# Patient Record
Sex: Female | Born: 1935 | Race: White | Hispanic: No | Smoking: Never smoker
Health system: Southern US, Community
[De-identification: ages and names within clinical notes are randomized; demographics above are authoritative.]

## PROBLEM LIST (undated history)

## (undated) DIAGNOSIS — K648 Other hemorrhoids: Secondary | ICD-10-CM

## (undated) DIAGNOSIS — K5790 Diverticulosis of intestine, part unspecified, without perforation or abscess without bleeding: Secondary | ICD-10-CM

## (undated) DIAGNOSIS — K219 Gastro-esophageal reflux disease without esophagitis: Secondary | ICD-10-CM

## (undated) DIAGNOSIS — I10 Essential (primary) hypertension: Secondary | ICD-10-CM

## (undated) DIAGNOSIS — Q613 Polycystic kidney, unspecified: Secondary | ICD-10-CM

## (undated) DIAGNOSIS — L439 Lichen planus, unspecified: Secondary | ICD-10-CM

## (undated) DIAGNOSIS — D126 Benign neoplasm of colon, unspecified: Secondary | ICD-10-CM

## (undated) HISTORY — DX: Benign neoplasm of colon, unspecified: D12.6

## (undated) HISTORY — PX: OOPHORECTOMY: SHX86

## (undated) HISTORY — DX: Polycystic kidney, unspecified: Q61.3

## (undated) HISTORY — PX: CHOLECYSTECTOMY: SHX55

## (undated) HISTORY — DX: Essential (primary) hypertension: I10

## (undated) HISTORY — PX: PARTIAL HYSTERECTOMY: SHX80

## (undated) HISTORY — PX: APPENDECTOMY: SHX54

## (undated) HISTORY — DX: Lichen planus, unspecified: L43.9

## (undated) HISTORY — DX: Other hemorrhoids: K64.8

## (undated) HISTORY — DX: Diverticulosis of intestine, part unspecified, without perforation or abscess without bleeding: K57.90

## (undated) HISTORY — DX: Gastro-esophageal reflux disease without esophagitis: K21.9

---

## 1994-05-29 ENCOUNTER — Encounter: Payer: Self-pay | Admitting: Gastroenterology

## 1997-12-03 ENCOUNTER — Ambulatory Visit (HOSPITAL_COMMUNITY): Admission: RE | Admit: 1997-12-03 | Discharge: 1997-12-03 | Payer: Self-pay | Admitting: Obstetrics & Gynecology

## 1998-04-12 ENCOUNTER — Ambulatory Visit (HOSPITAL_COMMUNITY): Admission: RE | Admit: 1998-04-12 | Discharge: 1998-04-12 | Payer: Self-pay | Admitting: Gynecology

## 1998-10-28 ENCOUNTER — Other Ambulatory Visit: Admission: RE | Admit: 1998-10-28 | Discharge: 1998-10-28 | Payer: Self-pay | Admitting: *Deleted

## 1998-10-28 ENCOUNTER — Ambulatory Visit (HOSPITAL_COMMUNITY): Admission: RE | Admit: 1998-10-28 | Discharge: 1998-10-28 | Payer: Self-pay | Admitting: Obstetrics & Gynecology

## 1999-04-06 ENCOUNTER — Ambulatory Visit (HOSPITAL_COMMUNITY): Admission: RE | Admit: 1999-04-06 | Discharge: 1999-04-06 | Payer: Self-pay | Admitting: Gynecology

## 1999-04-06 ENCOUNTER — Encounter: Payer: Self-pay | Admitting: Gynecology

## 1999-10-13 ENCOUNTER — Ambulatory Visit (HOSPITAL_COMMUNITY): Admission: RE | Admit: 1999-10-13 | Discharge: 1999-10-13 | Payer: Self-pay | Admitting: Obstetrics & Gynecology

## 2000-04-08 ENCOUNTER — Ambulatory Visit (HOSPITAL_COMMUNITY): Admission: RE | Admit: 2000-04-08 | Discharge: 2000-04-08 | Payer: Self-pay | Admitting: Gynecology

## 2000-04-08 ENCOUNTER — Encounter: Payer: Self-pay | Admitting: Gynecology

## 2000-09-27 ENCOUNTER — Ambulatory Visit (HOSPITAL_COMMUNITY): Admission: RE | Admit: 2000-09-27 | Discharge: 2000-09-27 | Payer: Self-pay | Admitting: Obstetrics & Gynecology

## 2001-02-20 DIAGNOSIS — D126 Benign neoplasm of colon, unspecified: Secondary | ICD-10-CM

## 2001-02-20 HISTORY — DX: Benign neoplasm of colon, unspecified: D12.6

## 2001-03-05 ENCOUNTER — Encounter (INDEPENDENT_AMBULATORY_CARE_PROVIDER_SITE_OTHER): Payer: Self-pay | Admitting: Specialist

## 2001-03-05 ENCOUNTER — Encounter: Payer: Self-pay | Admitting: Gastroenterology

## 2001-03-05 ENCOUNTER — Other Ambulatory Visit: Admission: RE | Admit: 2001-03-05 | Discharge: 2001-03-05 | Payer: Self-pay | Admitting: Gastroenterology

## 2001-04-21 ENCOUNTER — Ambulatory Visit (HOSPITAL_COMMUNITY): Admission: RE | Admit: 2001-04-21 | Discharge: 2001-04-21 | Payer: Self-pay

## 2001-07-09 ENCOUNTER — Encounter: Payer: Self-pay | Admitting: Gastroenterology

## 2001-10-10 ENCOUNTER — Ambulatory Visit (HOSPITAL_COMMUNITY): Admission: RE | Admit: 2001-10-10 | Discharge: 2001-10-10 | Payer: Self-pay | Admitting: Obstetrics & Gynecology

## 2002-04-22 ENCOUNTER — Ambulatory Visit (HOSPITAL_COMMUNITY): Admission: RE | Admit: 2002-04-22 | Discharge: 2002-04-22 | Payer: Self-pay | Admitting: *Deleted

## 2002-07-29 ENCOUNTER — Encounter: Payer: Self-pay | Admitting: Gastroenterology

## 2002-10-16 ENCOUNTER — Ambulatory Visit (HOSPITAL_COMMUNITY): Admission: RE | Admit: 2002-10-16 | Discharge: 2002-10-16 | Payer: Self-pay | Admitting: *Deleted

## 2003-05-12 ENCOUNTER — Ambulatory Visit (HOSPITAL_COMMUNITY): Admission: RE | Admit: 2003-05-12 | Discharge: 2003-05-12 | Payer: Self-pay | Admitting: Gynecology

## 2003-07-07 ENCOUNTER — Ambulatory Visit (HOSPITAL_COMMUNITY): Admission: RE | Admit: 2003-07-07 | Discharge: 2003-07-07 | Payer: Self-pay | Admitting: Gynecology

## 2003-07-07 ENCOUNTER — Encounter (INDEPENDENT_AMBULATORY_CARE_PROVIDER_SITE_OTHER): Payer: Self-pay | Admitting: Specialist

## 2004-05-16 ENCOUNTER — Ambulatory Visit (HOSPITAL_COMMUNITY): Admission: RE | Admit: 2004-05-16 | Discharge: 2004-05-16 | Payer: Self-pay | Admitting: Gynecology

## 2004-08-08 ENCOUNTER — Ambulatory Visit: Payer: Self-pay | Admitting: Gastroenterology

## 2004-08-28 ENCOUNTER — Ambulatory Visit: Payer: Self-pay | Admitting: Gastroenterology

## 2005-01-04 ENCOUNTER — Emergency Department (HOSPITAL_COMMUNITY): Admission: EM | Admit: 2005-01-04 | Discharge: 2005-01-04 | Payer: Self-pay | Admitting: Emergency Medicine

## 2005-05-17 ENCOUNTER — Ambulatory Visit (HOSPITAL_COMMUNITY): Admission: RE | Admit: 2005-05-17 | Discharge: 2005-05-17 | Payer: Self-pay | Admitting: Gynecology

## 2005-05-23 ENCOUNTER — Other Ambulatory Visit: Admission: RE | Admit: 2005-05-23 | Discharge: 2005-05-23 | Payer: Self-pay | Admitting: Gynecology

## 2005-11-14 ENCOUNTER — Ambulatory Visit: Payer: Self-pay | Admitting: Gastroenterology

## 2006-05-20 ENCOUNTER — Ambulatory Visit (HOSPITAL_COMMUNITY): Admission: RE | Admit: 2006-05-20 | Discharge: 2006-05-20 | Payer: Self-pay | Admitting: Family Medicine

## 2006-08-21 ENCOUNTER — Ambulatory Visit: Payer: Self-pay | Admitting: Gastroenterology

## 2006-09-04 ENCOUNTER — Encounter (INDEPENDENT_AMBULATORY_CARE_PROVIDER_SITE_OTHER): Payer: Self-pay | Admitting: *Deleted

## 2006-09-04 ENCOUNTER — Ambulatory Visit: Payer: Self-pay | Admitting: Gastroenterology

## 2006-11-13 ENCOUNTER — Other Ambulatory Visit: Admission: RE | Admit: 2006-11-13 | Discharge: 2006-11-13 | Payer: Self-pay | Admitting: Gynecology

## 2006-11-14 ENCOUNTER — Encounter: Admission: RE | Admit: 2006-11-14 | Discharge: 2006-11-14 | Payer: Self-pay | Admitting: Gynecology

## 2006-12-25 ENCOUNTER — Ambulatory Visit: Payer: Self-pay | Admitting: Internal Medicine

## 2007-05-22 ENCOUNTER — Ambulatory Visit (HOSPITAL_COMMUNITY): Admission: RE | Admit: 2007-05-22 | Discharge: 2007-05-22 | Payer: Self-pay | Admitting: Gynecology

## 2007-08-15 ENCOUNTER — Encounter: Admission: RE | Admit: 2007-08-15 | Discharge: 2007-08-15 | Payer: Self-pay | Admitting: Gynecology

## 2007-10-08 ENCOUNTER — Ambulatory Visit: Payer: Self-pay | Admitting: Gastroenterology

## 2008-05-24 ENCOUNTER — Ambulatory Visit (HOSPITAL_COMMUNITY): Admission: RE | Admit: 2008-05-24 | Discharge: 2008-05-24 | Payer: Self-pay | Admitting: Gynecology

## 2008-08-30 ENCOUNTER — Ambulatory Visit: Payer: Self-pay | Admitting: Gastroenterology

## 2008-09-14 ENCOUNTER — Encounter: Payer: Self-pay | Admitting: Gastroenterology

## 2008-09-14 ENCOUNTER — Ambulatory Visit: Payer: Self-pay | Admitting: Gastroenterology

## 2008-09-15 ENCOUNTER — Encounter: Payer: Self-pay | Admitting: Gastroenterology

## 2009-05-25 ENCOUNTER — Ambulatory Visit (HOSPITAL_COMMUNITY): Admission: RE | Admit: 2009-05-25 | Discharge: 2009-05-25 | Payer: Self-pay | Admitting: Family Medicine

## 2009-09-23 ENCOUNTER — Encounter: Admission: RE | Admit: 2009-09-23 | Discharge: 2009-09-23 | Payer: Self-pay | Admitting: Family Medicine

## 2009-09-29 ENCOUNTER — Telehealth: Payer: Self-pay | Admitting: Gastroenterology

## 2010-05-29 ENCOUNTER — Ambulatory Visit (HOSPITAL_COMMUNITY): Admission: RE | Admit: 2010-05-29 | Discharge: 2010-05-29 | Payer: Self-pay | Admitting: Gynecology

## 2010-06-06 ENCOUNTER — Telehealth: Payer: Self-pay | Admitting: Gastroenterology

## 2010-06-07 ENCOUNTER — Encounter (INDEPENDENT_AMBULATORY_CARE_PROVIDER_SITE_OTHER): Payer: Self-pay

## 2010-08-04 ENCOUNTER — Encounter: Payer: Self-pay | Admitting: Gastroenterology

## 2010-08-11 ENCOUNTER — Encounter: Payer: Self-pay | Admitting: Gastroenterology

## 2010-08-13 ENCOUNTER — Encounter: Payer: Self-pay | Admitting: Gynecology

## 2010-08-21 ENCOUNTER — Encounter: Payer: Self-pay | Admitting: Gastroenterology

## 2010-08-22 NOTE — Progress Notes (Signed)
Summary: Painful irritable rectal area  Phone Note Call from Patient Call back at Home Phone [phone]   Call For: Dr Russella Dar Reason for Call: Talk to Nurse Summary of Call: Very irritable rectal area and its very painful has never had this before and would like to get an appoinment to be seen.  Told office is already closed and nurse would call back in the morning said that was ok and if it got unbereable she would go to ed or urgent care. Initial call taken by: Leanor Kail [facility],  June 06, 2010 5:02 PM  Follow-up for Phone Call        phone is busy.  I will continue to try and reach the patient  Darcey Nora RN, [facility]  June 07, 2010 9:39 AM  Patient c/o reddened painful rash around rectum and lower buttocks.  No other complaints.  Patient  is advised to start Monistat cream to the rectal area she is asked to call back if she has any other complaints or no improvement in her symptoms. Follow-up by: Darcey Nora RN, CGRN,  June 07, 2010 10:00 AM  Additional Follow-up for Phone Call Additional follow up Details #1::        Agree with above and if no better will need to be seen. Please obtain a copy of the UGI series referred to in the 09/2009 phone note.  Additional Follow-up by: Meryl Dare MD Clementeen Graham,  June 07, 2010 10:07 AM

## 2010-08-22 NOTE — Progress Notes (Signed)
Summary: had ugi at [facility] Note Call from Patient Call back at Home Phone [phone]   Caller: Patient Call For: Russella Dar Reason for Call: Talk to Nurse Summary of Call: Patient states that she had a procedure (ugi) and was having them fax it over to Dr Russella Dar. Please call her when you receive those results Initial call taken by: Tawni Levy,  September 29, 2009 11:45 AM  Follow-up for Phone Call        Patient  had UGI series after seeing her primary care MD, they are send ing [place] a copy for her file.  I advied patient I will call her back if we don't recieve it. Follow-up by: Darcey Nora RN, CGRN,  September 29, 2009 11:51 AM

## 2010-08-24 NOTE — Letter (Signed)
Summary: Pre Visit Letter Revised  [facility]  [address] [place], [place] [address]   Phone: [phone]  Fax: [phone]        08/04/2010 MRN: [id number]  [facility] [address] [place], [place]  [address]             Procedure Date:  09-11-10 9:30am            Dr Russella Dar - Recall   Colon  Welcome to the Gastroenterology Division at [facility].    You are scheduled to see a nurse for your pre-procedure visit on 08-28-10 at 9am on the 3rd floor at [facility], [address]  We ask that you try to arrive at our office 15 minutes prior to your appointment time to allow for check-in.  Please take a minute to review the attached form.  If you answer "Yes" to one or more of the questions on the first page, we ask that you call the person listed at your earliest opportunity.  If you answer "No" to all of the questions, please complete the rest of the form and bring it to your appointment.    Your nurse visit will consist of discussing your medical and surgical history, your immediate family medical history, and your medications.   If you are unable to list all of your medications on the form, please bring the medication bottles to your appointment and we will list them.  We will need to be aware of both prescribed and over the counter drugs.  We will need to know exact dosage information as well.    Please be prepared to read and sign documents such as consent forms, a financial agreement, and acknowledgement forms.  If necessary, and with your consent, a friend or relative is welcome to sit-in on the nurse visit with you.  Please bring your insurance card so that we may make a copy of it.  If your insurance requires a referral to see a specialist, please bring your referral form from your primary care physician.  No co-pay is required for this nurse visit.     If you cannot keep your appointment, please call [phone] to cancel or reschedule  prior to your appointment date.  This allows [place] the opportunity to schedule an appointment for another patient in need of care.    Thank you for choosing [facility] for your medical needs.  We appreciate the opportunity to care for you.  Please visit [place] at our website  to learn more about our practice.  Sincerely, The Gastroenterology Division

## 2010-08-25 ENCOUNTER — Encounter: Payer: Self-pay | Admitting: Gastroenterology

## 2010-08-25 ENCOUNTER — Ambulatory Visit (INDEPENDENT_AMBULATORY_CARE_PROVIDER_SITE_OTHER): Payer: Medicare Other | Admitting: Gastroenterology

## 2010-08-25 DIAGNOSIS — Z860100 Personal history of colon polyps, unspecified: Secondary | ICD-10-CM

## 2010-08-25 DIAGNOSIS — K219 Gastro-esophageal reflux disease without esophagitis: Secondary | ICD-10-CM | POA: Insufficient documentation

## 2010-08-25 DIAGNOSIS — R1319 Other dysphagia: Secondary | ICD-10-CM

## 2010-08-28 ENCOUNTER — Telehealth: Payer: Self-pay | Admitting: Gastroenterology

## 2010-08-30 NOTE — Procedures (Signed)
Summary: Colonoscopy   Colonoscopy  Procedure date:  08/28/2004  Findings:      Results: Polyp.  Results: Diverticulosis.       Location:  [facility].    Procedures Next Due Date:    Colonoscopy: 08/2006  Colonoscopy  Procedure date:  08/28/2004  Findings:      Results: Polyp.  Results: Diverticulosis.       Location:  [facility].    Procedures Next Due Date:    Colonoscopy: 08/2006 Patient Name: Natayla, Cadenhead MRN:  Procedure Procedures: Colonoscopy CPT: 16109.    with polypectomy. CPT: A3573898.  Personnel: Endoscopist: Venita Lick. Russella Dar, MD, Clementeen Graham.  Exam Location: Exam performed in Outpatient Clinic. Outpatient  Patient Consent: Procedure, Alternatives, Risks and Benefits discussed, consent obtained, from patient. Consent was obtained by the RN.  Indications  Surveillance of: Adenomatous Polyp(s). Initial polypectomy was performed in 2002. in Aug. Largest polyp removed was > 19 mm. Pathology of worst  polyp: tubulovillous adenoma.  History  Current Medications: Patient is not currently taking Coumadin.  Pre-Exam Physical: Performed Aug 28, 2004. Cardio-pulmonary exam, Rectal exam, HEENT exam , Abdominal exam, Neurological exam, Mental status exam WNL.  Exam Exam: Extent of exam reached: Cecum, extent intended: Cecum.  The cecum was identified by appendiceal orifice and IC valve. Colon retroflexion performed. ASA Classification: II. Tolerance: excellent.  Monitoring: Pulse and BP monitoring, Oximetry used. Supplemental O2 given.  Colon Prep Used Miralax for colon prep. Prep results: good.  Sedation Meds: Patient assessed and found to be appropriate for moderate (conscious) sedation. Fentanyl 75 mcg. given IV. Versed 8 mg. given IV.  Findings NORMAL EXAM: Hepatic Flexure to Splenic Flexure.  POLYP: Ascending Colon, Maximum size: 25 mm. sessile polyp. Procedure:  snare with cautery, The polyp was removed piece meal.  removed, retrieved, Polyp sent to pathology. ICD9: Colon Polyps: 211.3. Comments: located at prior polypectomy site.  - DIVERTICULOSIS: Descending Colon to Sigmoid Colon. Not bleeding. ICD9: Diverticulosis: 562.10.  NORMAL EXAM: Cecum.  NORMAL EXAM: Rectum.   Assessment  Diagnoses: 211.3: Colon Polyps.  562.10: Diverticulosis.   Events  Unplanned Interventions: No intervention was required.  Unplanned Events: There were no complications. Plans  Post Exam Instructions: No aspirin or non-steroidal containing medications: 2 weeks.  Medication Plan: Await pathology.  Patient Education: Patient given standard instructions for: Polyps. Diverticulosis.  Disposition: After procedure patient sent to recovery. After recovery patient sent home.  Scheduling/Referral: Colonoscopy, to [facility] T. Russella Dar, MD, [facility], around Aug 28, 2006.    This report was created from the original endoscopy report, which was reviewed and signed by the above listed endoscopist.    cc: Catha Gosselin, MD     Beather Arbour, MD

## 2010-08-30 NOTE — Procedures (Signed)
Summary: Colonoscopy   Colonoscopy  Procedure date:  07/29/2002  Findings:      Results: Polyp.  Results: Diverticulosis.       Location:  [facility].    Procedures Next Due Date:    Colonoscopy: 07/2004 Patient Name: Monaye, Blackie MRN:  Procedure Procedures: Colonoscopy CPT: 16109.    with Hot Biopsy(s)CPT: [id number].  Personnel: Endoscopist: Venita Lick. Russella Dar, MD, Clementeen Graham.  Exam Location: Exam performed in Outpatient Clinic. Outpatient  Patient Consent: Procedure, Alternatives, Risks and Benefits discussed, consent obtained, from patient. Consent was obtained by the RN.  Indications Symptoms: Hematochezia.  Surveillance of: Adenomatous Polyp(s). Initial polypectomy was performed in 2002. in Aug. Largest polyp removed was > 19 mm. Prior polyp located in proximal (splenic flexure and beyond) colon. Pathology of worst  polyp: tubulovillous adenoma.  History  Pre-Exam Physical: Performed Jul 29, 2002. Cardio-pulmonary exam WNL. Rectal exam abnormal. HEENT exam , Abdominal exam, Extremity exam, Neurological exam, Mental status exam WNL. Abnormal PE findings include: ext. hem. tags.  Exam Exam: Extent of exam reached: Cecum, extent intended: Cecum.  The cecum was identified by appendiceal orifice and IC valve. Colon retroflexion performed. ASA Classification: II. Tolerance: good.  Monitoring: Pulse and BP monitoring, Oximetry used. Supplemental O2 given.  Colon Prep Used Golytely for colon prep. Prep results: good.  Sedation Meds: Patient assessed and found to be appropriate for moderate (conscious) sedation. Fentanyl 100 mcg. given IV. Versed 8 mg. given IV.  Findings NORMAL EXAM: Hepatic Flexure.  POLYP: Transverse Colon, Maximum size: 6 mm. sessile polyp. Procedure:  hot biopsy, removed, retrieved, Polyp sent to pathology. ICD9: Colon Polyps: 211.3.  NORMAL EXAM: Splenic Flexure to Descending Colon.  OTHER FINDING: Ascending Colon. Comments:  prior polypectomy site.  - DIVERTICULOSIS: Sigmoid Colon. Not bleeding. ICD9: Diverticulosis: 562.10.  NORMAL EXAM: Cecum.  NORMAL EXAM: Rectum.   Assessment  Diagnoses: 211.3: Colon Polyps.  562.10: Diverticulosis.   Events  Unplanned Interventions: No intervention was required.  Unplanned Events: There were no complications. Plans  Post Exam Instructions: No aspirin or non-steroidal containing medications: 2 weeks.  Medication Plan: Await pathology. Continue current medications.  Patient Education: Patient given standard instructions for: Polyps. Diverticulosis.  Disposition: After procedure patient sent to recovery. After recovery patient sent home.  Scheduling/Referral: Colonoscopy, to [facility] T. Russella Dar, MD, [facility], around Jul 29, 2004.  Primary Care Provider, to Barry Dienes. Eloise Harman, MD,    This report was created from the original endoscopy report, which was reviewed and signed by the above listed endoscopist.  cc: Barry Dienes. Eloise Harman, MD

## 2010-08-30 NOTE — Procedures (Signed)
Summary: Colonoscopy   Colonoscopy  Procedure date:  07/09/2001  Findings:      Results: Polyp.  Results: Diverticulosis.       Location:  [facility].    Procedures Next Due Date:    Colonoscopy: 07/2004 Patient Name: Erica Simpson, Erica Simpson MRN:  Procedure Procedures: Colonoscopy CPT: 04540.    with polypectomy. CPT: A3573898.  Personnel: Endoscopist: Venita Lick. Russella Dar, MD, Clementeen Graham.  Referred By: Barry Dienes. Eloise Harman, MD.  Exam Location: Exam performed in Outpatient Clinic. Outpatient  Patient Consent: Procedure, Alternatives, Risks and Benefits discussed, consent obtained, from patient.  Indications  Evaluation of: Polyps seen on prior Colonoscopy.  Comments: Large sessile polyp not completely removed at recent colonoscopy. History  Pre-Exam Physical: Performed Jul 09, 2001. Entire physical exam was normal.  Exam Exam: Extent of exam reached: Cecum, extent intended: Cecum.  The cecum was identified by appendiceal orifice and IC valve. Colon retroflexion performed. ASA Classification: II. Tolerance: good.  Monitoring: Pulse and BP monitoring, Oximetry used. Supplemental O2 given.  Colon Prep Used Golytely for colon prep. Prep results: good.  Sedation Meds: Patient assessed and found to be appropriate for moderate (conscious) sedation. Fentanyl 100 mcg. given IV. Versed 5 mg. given IV.  Findings NORMAL EXAM: Hepatic Flexure to Descending Colon.  POLYP: Ascending Colon, Maximum size: 30 mm. sessile polyp. Procedure:  snare with cautery, The polyp was removed piece meal. removed, retrieved, Polyp sent to pathology. ICD9: Colon Polyps: 211.3. Comments: appears to be completely removed.  - DIVERTICULOSIS: Sigmoid Colon. Not bleeding. ICD9: Diverticulosis: 562.10.  NORMAL EXAM: Cecum.   Assessment  Diagnoses: 211.3: Colon Polyps.  562.10: Diverticulosis.   Events  Unplanned Interventions: No intervention was required.  Unplanned Events: There were  no complications. Plans  Post Exam Instructions: No aspirin or non-steroidal containing medications: 2 weeks.  Medication Plan: Await pathology. Continue current medications.  Patient Education: Patient given standard instructions for: Polyps.  Disposition: After procedure patient sent to recovery. After recovery patient sent home.  Scheduling/Referral: Colonoscopy, to [facility] T. Russella Dar, MD, [facility], to assess for completeness of polypectomy, around Jul 09, 2002.  Referring provider, to Barry Dienes. Eloise Harman, MD,   Path Results Pathology: Polyp: Ascending Colon.      Patient Name: Erica Simpson, Erica Simpson MRN:  Addendum 1 - Jul 09, 2001 0 Plans DELETED<<>>DELETED Scheduling/Referral: DELETED<<Colonoscopy, to [employer]. Russella Dar, MD, [facility], to assess for completeness of polypectomy, around Jul 09, 2004.  >>DELETED Scheduling/Referral: Colonoscopy, to [facility] T. Russella Dar, MD, [facility], to assess for completeness of polypectomy, around Jul 09, 2002.    [Fellow] This report was created from the original endoscopy report, which was reviewed and signed by the above listed endoscopist.    cc: Jarome Matin, MD     Beather Arbour, MD

## 2010-08-30 NOTE — Assessment & Plan Note (Signed)
Summary: acid reflux meds not working   History of Present Illness Visit Type: Follow-up Visit Primary GI MD: Elie Goody MD [facility] Primary Provider: Catha Gosselin, MD Chief Complaint: acid reflux  History of Present Illness:   Erica Simpson notes frequent reflux symptoms occurring at least 2-3 days each week. She notes regurgitation and nighttime symptoms. She also has intermittent solid food dysphagia. She took Prilosec OTC for 2 weeks and noted a substantial improvement in symptoms. However, she discontinued it and her symptoms returned. She went underwent upper GI series in March 2011 which showed: IMPRESSION:   1.  Small sliding-type hiatal hernia and a widely patent lower esophageal mucosal ring.   2.  Spontaneous and inducible GE reflux.   3.  Normal appearance of the stomach and duodenum.    GI Review of Systems    Reports acid reflux, chest pain, and  dysphagia with solids.      Denies abdominal pain, belching, bloating, dysphagia with liquids, heartburn, loss of appetite, nausea, vomiting, vomiting blood, weight loss, and  weight gain.        Denies anal fissure, black tarry stools, change in bowel habit, constipation, diarrhea, diverticulosis, fecal incontinence, heme positive stool, hemorrhoids, irritable bowel syndrome, jaundice, light color stool, liver problems, rectal bleeding, and  rectal pain.   Current Medications (verified): 1)  Ramipril 5 Mg Caps (Ramipril) .Marland Kitchen.. 1 By Mouth Two Times A Day 2)  Amlodipine Besylate 5 Mg Tabs (Amlodipine Besylate) .Marland Kitchen.. 1 By Mouth Once Daily 3)  Loratadine 10 Mg Tabs (Loratadine) .Marland Kitchen.. 1 By Mouth Once Daily 4)  Clobetasol Propionate 0.05 % Oint (Clobetasol Propionate) .... Use As Directed 5)  Metamucil 30.9 % Powd (Psyllium) .Marland Kitchen.. 1 Tablespoon in 8 0z Water Daily 6)  Vitamin D3 2000 Unit Caps (Cholecalciferol) .Marland Kitchen.. 1 By Mouth Once Daily  Allergies (verified): No Known Drug Allergies  Past History:  Past Medical History: Reviewed  history from 08/23/2010 and no changes required. Diverticulosis Hemorrhoids Adenomatous Colon Polyps 02/2001 Polycystic Kidney Disease GERD Hypertension  Past Surgical History: Reviewed history from 08/23/2010 and no changes required. Oopherectomy Appendectomy Cholecystectomy Partial Hysterectomy  Family History: Reviewed history from 08/23/2010 and no changes required. Family History of Breast Cancer:Sister Family History of Heart Disease:  No FH of Colon Cancer:  Social History: Reviewed history from 08/23/2010 and no changes required. Retired widowed Patient has never smoked.  Alcohol Use - yes Illicit Drug Use - no  Review of Systems       The pertinent positives and negatives are noted as above and in the HPI. All other ROS were reviewed and were negative.   Vital Signs:  Patient profile:   75 year old female Height:      64 inches Weight:      172 pounds BMI:     29.63 Pulse rate:   72 / minute Pulse rhythm:   regular BP sitting:   110 / 68  (left arm)  Vitals Entered By: Milford Cage NCMA (August 25, 2010 3:17 PM)  Physical Exam  General:  Well developed, well nourished, no acute distress. Head:  Normocephalic and atraumatic. Eyes:  PERRLA, no icterus. Mouth:  No deformity or lesions, dentition normal. Lungs:  Clear throughout to auscultation. Heart:  Regular rate and rhythm; no murmurs, rubs,  or bruits. Abdomen:  Soft, nontender and nondistended. No masses, hepatosplenomegaly or hernias noted. Normal bowel sounds. Rectal:  deferred until time of colonoscopy.   Psych:  Alert and cooperative. Normal mood  and affect.  Impression & Recommendations:  Problem # 1:  GERD (ICD-530.81) GERD with frequent symptoms, nocturnal symptoms and occasional dysphagia. Begin standard antireflux measures and omeprazole 40 mg q.a.m. If all her symptoms are not controlled within the next few weeks will proceed with upper endoscopy for further evaluation.  Problem #  2:  DYSPHAGIA (EAV-409.81) See above.  Problem # 3:  PERSONAL HISTORY OF COLONIC POLYPS (ICD-V12.72) Personal history of adenomatous colon polyps, initially diagnosed in 2002. She is due for surveillance colonoscopy. The risks, benefits and alternatives to colonoscopy with possible biopsy and possible polypectomy were discussed with the patient and they consent to proceed. The procedure will be scheduled electively. Orders: Colonoscopy (Colon)  Patient Instructions: 1)  Pick up your prep from your pharmacy.  2)  Colonoscopy brochure given.  3)  Start omeprazole 40mg  one talet by mouth once daily and a prescription has been sent to your pharmacy.  4)  Avoid foods high in acid content ( tomatoes, citrus juices, spicy foods) . Avoid eating within 3 to 4 hours of lying down or before exercising. Do not over eat; try smaller more frequent meals. Elevate head of bed four inches when sleeping.  5)  Copy sent to : Catha Gosselin, MD 6)                        Marina Gravel, MD 7)  The medication list was reviewed and reconciled.  All changed / newly prescribed medications were explained.  A complete medication list was provided to the patient / caregiver.  Prescriptions: MOVIPREP 100 GM  SOLR (PEG-KCL-NACL-NASULF-NA ASC-C) As per prep instructions.  #1 x 0   Entered by:   Christie Nottingham CMA (AAMA)   Authorized by:   Meryl Dare MD [facility]   Signed by:   Christie Nottingham CMA (AAMA) on 08/25/2010   Method used:   Electronically to        [facility]  [phone]* (retail)       [address]       [place], [place]  [address]       Ph: [phone] or [phone]       Fax: [phone]   RxID:   [id number]

## 2010-08-30 NOTE — Procedures (Signed)
Summary: Colonoscopy and biopsy   Colonoscopy  Procedure date:  03/05/2001  Findings:      Results: Polyp.  Results: Diverticulosis.       Location:  [facility].    Procedures Next Due Date:    Colonoscopy: 06/2001 Patient Name: Erica Simpson, Erica Simpson MRN:  Procedure Procedures: Colonoscopy CPT: 27253.    with polypectomy. CPT: A3573898.  Personnel: Endoscopist: Venita Lick. Russella Dar, MD, Clementeen Graham.  Referred By: Barry Dienes. Eloise Harman, MD.  Exam Location: Exam performed in Outpatient Clinic. Outpatient  Patient Consent: Procedure, Alternatives, Risks and Benefits discussed, consent obtained, from patient.  Indications Symptoms: Hematochezia.  Increased Risk Screening: For family history of colorectal neoplasia, in  parent  History  Pre-Exam Physical: Performed Mar 05, 2001. Cardio-pulmonary exam, Rectal exam, HEENT exam , Abdominal exam, Extremity exam, Neurological exam, Mental status exam WNL.  Exam Exam: Extent of exam reached: Cecum, extent intended: Cecum.  The cecum was identified by appendiceal orifice and IC valve. Colon retroflexion performed. ASA Classification: II. Tolerance: good.  Monitoring: Pulse and BP monitoring, Oximetry used. Supplemental O2 given.  Colon Prep Used Golytely for colon prep. Prep results: good.  Sedation Meds: Patient assessed and found to be appropriate for moderate (conscious) sedation. Fentanyl 100 mcg. Versed 7 mg.  Findings - DIVERTICULOSIS: Descending Colon to Sigmoid Colon. Not bleeding. ICD9: Diverticulosis: 562.10.  POLYP: Ascending Colon, Maximum size: 40 mm. sessile polyp. Procedure:  snare with cautery, The polyp was removed piece meal. removed, retrieved, Polyp sent to pathology. ICD9: Colon Polyps: 211.3. Comments: about 50% of the polyp was removed-polypectomy not completed due to large size.  NORMAL EXAM: Cecum.   Assessment  Diagnoses: 211.3: Colon Polyps.  562.10: Diverticulosis.   Events  Unplanned  Interventions: No intervention was required.  Unplanned Events: There were no complications. Plans  Post Exam Instructions: No aspirin or non-steroidal containing medications: 2 weeks.  Medication Plan: Await pathology. Continue current medications.  Patient Education: Patient given standard instructions for: Diverticulosis.  Disposition: After procedure patient sent to recovery. After recovery patient sent home.  Scheduling/Referral: Colonoscopy, to [facility] T. Russella Dar, MD, [facility], if polyp is adenomatous to complete the polypectomy, around Jul 05, 2001.  Referring provider, to Barry Dienes. Eloise Harman, MD, Mar 06, 2001.    This report was created from the original endoscopy report, which was reviewed and signed by the above listed endoscopist.    cc: Jarome Matin, MD        SP Surgical Pathology - STATUS: Final             By: Morrie Sheldon,       Perform Date: [id number] 16:50  Ordered By: Rica Records Date:  Facility: [facility]                              Department: CPATH  Service Report Text  [facility]   [address] [place], [place] [address]   [phone]    REPORT OF SURGICAL PATHOLOGY    Case #: [id number]   Patient Name: Erica, Simpson   PID: [id number]   Pathologist: Beulah Gandy. Luisa Hart, MD   DOB/Age 11-Jan-1936 (Age: 75) Gender: F   Date Taken: 03/05/2001   Date Received: 03/05/2001    FINAL DIAGNOSIS    ***MICROSCOPIC EXAMINATION AND DIAGNOSIS***    COLON, ASCENDING POLYP: FRAGMENTS OF  TUBULOVILLOUS ADENOMA. NO   HIGH GRADE DYSPLASIA OR MALIGNANCY IDENTIFIED.    cf   Date Reported: 03/06/2001 Beulah Gandy. Luisa Hart, MD   *** Electronically Signed Out By JDP ***    Clinical information   R/O POLYP (ac)    specimen(s) obtained   HOT SNARE BX ASCENDING COLON    Gross Description   Received in formalin is a 1.4 x 0.9 x 0.4 cm aggregate of   irregular to polypoid tan to hyperemic tissue fragments,  which   are submitted in one block. (SW:gt) 03/06/01    gdt/

## 2010-08-30 NOTE — Letter (Signed)
Summary: [facility] Instructions  [facility]  [address] [place], [place] [address]   Phone: [phone]  Fax: [phone]       KEMORA PINARD    25-May-1936    MRN: [id number]        Procedure Day Dorna Bloom: Wednesday February 15th, 2012     Arrival Time: 8:30am     Procedure Time: 9:30am     Location of Procedure:                    _ x_  [facility] (4th Floor)                        PREPARATION FOR COLONOSCOPY WITH MOVIPREP   Starting 5 days prior to your procedure 09/01/10 do not eat nuts, seeds, popcorn, corn, beans, peas,  salads, or any raw vegetables.  Do not take any fiber supplements (e.g. Metamucil, Citrucel, and Benefiber).  THE DAY BEFORE YOUR PROCEDURE         DATE: 09/05/10  DAY: Thursday  1.  Drink clear liquids the entire day-NO SOLID FOOD  2.  Do not drink anything colored red or purple.  Avoid juices with pulp.  No orange juice.  3.  Drink at least 64 oz. (8 glasses) of fluid/clear liquids during the day to prevent dehydration and help the prep work efficiently.  CLEAR LIQUIDS INCLUDE: Water Jello Ice Popsicles Tea (sugar ok, no milk/cream) Powdered fruit flavored drinks Coffee (sugar ok, no milk/cream) Gatorade Juice: apple, white grape, white cranberry  Lemonade Clear bullion, consomm, broth Carbonated beverages (any kind) Strained chicken noodle soup Hard Candy                             4.  In the morning, mix first dose of MoviPrep solution:    Empty 1 Pouch A and 1 Pouch B into the disposable container    Add lukewarm drinking water to the top line of the container. Mix to dissolve    Refrigerate (mixed solution should be used within 24 hrs)  5.  Begin drinking the prep at 5:00 p.m. The MoviPrep container is divided by 4 marks.   Every 15 minutes drink the solution down to the next mark (approximately 8 oz) until the full liter is complete.   6.  Follow completed prep with 16 oz of clear liquid of your  choice (Nothing red or purple).  Continue to drink clear liquids until bedtime.  7.  Before going to bed, mix second dose of MoviPrep solution:    Empty 1 Pouch A and 1 Pouch B into the disposable container    Add lukewarm drinking water to the top line of the container. Mix to dissolve    Refrigerate  THE DAY OF YOUR PROCEDURE      DATE: 09/06/10 DAY: Wednesday  Beginning at 4:30 a.m. (5 hours before procedure):         1. Every 15 minutes, drink the solution down to the next mark (approx 8 oz) until the full liter is complete.  2. Follow completed prep with 16 oz. of clear liquid of your choice.    3. You may drink clear liquids until 7:30am (2 HOURS BEFORE PROCEDURE).   MEDICATION INSTRUCTIONS  Unless otherwise instructed, you should take regular prescription medications with a small sip of water   as early as possible the morning of your  procedure.         OTHER INSTRUCTIONS  You will need a responsible adult at least 73 years of age to accompany you and drive you home.   This person must remain in the waiting room during your procedure.  Wear loose fitting clothing that is easily removed.  Leave jewelry and other valuables at home.  However, you may wish to bring a book to read or  an iPod/MP3 player to listen to music as you wait for your procedure to start.  Remove all body piercing jewelry and leave at home.  Total time from sign-in until discharge is approximately 2-3 hours.  You should go home directly after your procedure and rest.  You can resume normal activities the  day after your procedure.  The day of your procedure you should not:   Drive   Make legal decisions   Operate machinery   Drink alcohol   Return to work  You will receive specific instructions about eating, activities and medications before you leave.    The above instructions have been reviewed and explained to me by   _______________________    I fully understand and can  verbalize these instructions _____________________________ Date _________

## 2010-08-30 NOTE — Procedures (Signed)
Summary: Colonoscopy and biopsy   Colonoscopy  Procedure date:  09/04/2006  Findings:      Results: Polyp.  Results: Hemorrhoids.     Results: Diverticulosis.       Location:  [facility].    Procedures Next Due Date:    Colonoscopy: 08/2008 Patient Name: Erica Simpson, Erica Simpson MRN:  Procedure Procedures: Colonoscopy CPT: 04540.    with Hot Biopsy(s)CPT: Z451292.    with polypectomy. CPT: A3573898.  Personnel: Endoscopist: Venita Lick. Russella Dar, MD, Clementeen Graham.  Exam Location: Exam performed in Outpatient Clinic. Outpatient  Patient Consent: Procedure, Alternatives, Risks and Benefits discussed, consent obtained, from patient. Consent was obtained by the RN.  Indications  Surveillance of: Adenomatous Polyp(s). Initial polypectomy was performed in 2002. in Aug. Largest polyp removed was > 19 mm. Pathology of worst  polyp: tubulovillous adenoma.  History  Current Medications: Patient is not currently taking Coumadin.  Pre-Exam Physical: Performed Sep 04, 2006. Cardio-pulmonary exam WNL. Rectal exam abnormal. HEENT exam , Abdominal exam, Mental status exam WNL. Abnormal PE findings include: ext hem.  Comments: Pt. history reviewed/updated, physical exam performed prior to initiation of sedation?Yes Exam Exam: Extent of exam reached: Cecum, extent intended: Cecum.  The cecum was identified by appendiceal orifice and IC valve. Time to Cecum: 00:04: 20. Time for Withdrawl: 00:13:12. Colon retroflexion performed. ASA Classification: II. Tolerance: excellent.  Monitoring: Pulse and BP monitoring, Oximetry used. Supplemental O2 given.  Colon Prep Used MoviPrep for colon prep. Prep results: excellent.  Sedation Meds: Patient assessed and found to be appropriate for moderate (conscious) sedation. Fentanyl 75 mcg. given IV. Versed 9 mg. given IV.  Findings NORMAL EXAM: Hepatic Flexure to Splenic Flexure.  POLYP: Ascending Colon, Maximum size: 8 mm. sessile polyp.  Procedure:  biopsy without cautery, The polyp was removed piece meal. removed, retrieved, Polyp sent to pathology. ICD9: Colon Polyps: 211.3. Comments: Residual polyp tissue at prior polypectomy site removed piecemeal with snare with cautery and hot biopsy. Polypectomy seems complete.  - DIVERTICULOSIS: Descending Colon to Sigmoid Colon. Not bleeding. ICD9: Diverticulosis: 562.10.  NORMAL EXAM: Cecum.  HEMORRHOIDS: Internal. Size: Small. Not bleeding. Not thrombosed. ICD9: Hemorrhoids, Internal: 455.0.   Assessment  Diagnoses: 211.3: Colon Polyps.  562.10: Diverticulosis.  455.0: Hemorrhoids, Internal.   Events  Unplanned Interventions: No intervention was required.  Unplanned Events: There were no complications. Plans  Post Exam Instructions: Post sedation instructions given. No aspirin or non-steroidal containing medications: 2 weeks.  Medication Plan: Await pathology.  Patient Education: Patient given standard instructions for: Polyps. Diverticulosis. Hemorrhoids.  Disposition: After procedure patient sent to recovery. After recovery patient sent home.  Scheduling/Referral: Colonoscopy, to [facility] T. Russella Dar, MD, [facility], around Sep 04, 2008.    This report was created from the original endoscopy report, which was reviewed and signed by the above listed endoscopist.    cc: Beather Arbour, MD      SP Surgical Pathology - STATUS: Final             By: Osie Bond  ,      Perform Date: 13Feb08 00:01  Ordered By: Rica Records Date: 13Feb08 14:29  Facility: [facility]                               Department: CPATH  Service Report Text  [facility]   [id number]  [place], [place] [address]   Telephone [phone] or [phone] Fax [phone]    REPORT OF SURGICAL PATHOLOGY    Case #: [id number]   Patient Name: Erica, Simpson.   Office Chart Number: [id number]    MRN: [id number]   Pathologist: Renato Battles, M.D.   DOB/Age [birth year] (Age: 39) Gender: F   Date Taken: 09/04/2006   Date Received: 09/04/2006    FINAL DIAGNOSIS    ***MICROSCOPIC EXAMINATION AND DIAGNOSIS***    ASCENDING COLON, BIOPSY: FRAGMENTS OF TUBULAR ADENOMA. NO HIGH   GRADE DYSPLASIA OR MALIGNANCY IDENTIFIED.    COMMENT   There is adenomatous epithelium having a predominantly tubular   growth pattern consistent with a tubular adenoma if the biopsy is   representative of the entire lesion. No high grade dysplasia or   evidence of malignancy is identified. Clinical correlation is   recommended. (MS:mj 09/05/06)    mj   Date Reported: 09/05/2006 Renato Battles, M.D.   *** Electronically Signed Out By MS ***    Clinical information   History of polyps. Ascending polyp, R/O adenomatous piecemeal   removal prior to polypectomy site (kv)    specimen(s) obtained   Colon, polyp(s), ascending    Gross Description   Received in formalin are tan, soft tissue fragments that are   submitted in toto. Number: 8.   Size: 0.1 to 0.5 cm. One block. (TB:cdc:09/04/06)    cc/

## 2010-09-06 ENCOUNTER — Encounter: Payer: Self-pay | Admitting: Gastroenterology

## 2010-09-06 ENCOUNTER — Other Ambulatory Visit: Payer: Self-pay | Admitting: Gastroenterology

## 2010-09-06 ENCOUNTER — Other Ambulatory Visit (AMBULATORY_SURGERY_CENTER): Payer: Medicare Other | Admitting: Gastroenterology

## 2010-09-06 DIAGNOSIS — K5730 Diverticulosis of large intestine without perforation or abscess without bleeding: Secondary | ICD-10-CM

## 2010-09-06 DIAGNOSIS — K648 Other hemorrhoids: Secondary | ICD-10-CM

## 2010-09-06 DIAGNOSIS — Z860100 Personal history of colon polyps, unspecified: Secondary | ICD-10-CM

## 2010-09-06 DIAGNOSIS — Z1211 Encounter for screening for malignant neoplasm of colon: Secondary | ICD-10-CM

## 2010-09-07 NOTE — Procedures (Signed)
Summary: [employer]   Imported By: Sherian Rein 08/28/2010 09:32:03  _____________________________________________________________________  External Attachment:    Type:   Image     Comment:   External Document

## 2010-09-07 NOTE — Letter (Signed)
Summary: Zada Girt MD/[facility]  Zada Girt MD/ [facility]   Imported By: Lester Hiwassee 09/01/2010 14:49:54  _____________________________________________________________________  External Attachment:    Type:   Image     Comment:   External Document

## 2010-09-07 NOTE — Progress Notes (Signed)
Summary: rx not in  pharmacy  Phone Note Call from Patient Call back at Home Phone [phone]   Caller: Patient Call For: Dr Russella Dar Reason for Call: Talk to Nurse Summary of Call: Patient states that her rx was never sent for Omeprazole  Initial call taken by: Tawni Levy,  August 28, 2010 8:53 AM  Follow-up for Phone Call        Rx was sent to pts pharmacy.  Follow-up by: Christie Nottingham CMA Duncan Dull),  August 28, 2010 8:56 AM    New/Updated Medications: OMEPRAZOLE 40 MG CPDR (OMEPRAZOLE) one tablet by mouth once daily Prescriptions: OMEPRAZOLE 40 MG CPDR (OMEPRAZOLE) one tablet by mouth once daily  #30 x 11   Entered by:   Christie Nottingham CMA (AAMA)   Authorized by:   Meryl Dare MD [facility]   Signed by:   Christie Nottingham CMA (AAMA) on 08/28/2010   Method used:   Electronically to        [facility]  [phone]* (retail)       [address]       [place], [place]  [address]       Ph: [phone] or [phone]       Fax: [phone]   RxID:   [id number]

## 2010-09-13 NOTE — Procedures (Signed)
Summary: Colonoscopy  Patient: [name] Note: All result statuses are Final unless otherwise noted.  Tests: (1) Colonoscopy (COL)   COL Colonoscopy           DONE     [facility]     [address]     [place], [place]  [address]           COLONOSCOPY PROCEDURE REPORT     PATIENT:  Erica Simpson, Erica Simpson  MR#:  [id number]     BIRTHDATE:  08-10-1935, 75 yrs. old  GENDER:  female     ENDOSCOPIST:  Judie Petit T. Russella Dar, MD, [facility]           PROCEDURE DATE:  09/06/2010     PROCEDURE:  Higher-risk screening colonoscopy G0105     ASA CLASS:  Class II     INDICATIONS:  1) surveillance and high-risk screening  2) history     of pre-cancerous (adenomatous) colon polyps : 02/2001.     MEDICATIONS:   Fentanyl 50 mcg IV, Versed 5 mg IV     DESCRIPTION OF PROCEDURE:   After the risks benefits and     alternatives of the procedure were thoroughly explained, informed     consent was obtained.  Digital rectal exam was performed and     revealed no abnormalities.   The LB [id number]-[id number] endoscope     was introduced through the anus and advanced to the cecum, which     was identified by both the appendix and ileocecal valve, without     limitations.  The quality of the prep was excellent, using     MoviPrep.  The instrument was then slowly withdrawn as the colon     was fully examined.     <<PROCEDUREIMAGES>>     FINDINGS:  Moderate diverticulosis was found in the sigmoid to     descending colon. A normal appearing cecum, ileocecal valve, and     appendiceal orifice were identified. The ascending, hepatic     flexure, transverse, splenic flexure, and rectum appeared     unremarkable. Retroflexed views in the rectum revealed internal     hemorrhoids, small.  The time to cecum =  3.75  minutes. The scope     was then withdrawn (time =  8.25  min) from the patient and the     procedure completed.           COMPLICATIONS:  None           ENDOSCOPIC IMPRESSION:     1) Moderate  diverticulosis in the sigmoid to descending colon     2) Internal hemorrhoids           RECOMMENDATIONS:     1) High fiber diet with liberal fluid intake.     2) Repeat Colonoscopy in 5 years.           Venita Lick. Russella Dar, MD, [facility] Graham           CC:  Catha Gosselin, MD           n.     Rosalie DoctorVenita Lick. Skarlette Lattner at 09/06/2010 10:03 AM           Princess Bruins, [id number]  Note: An exclamation mark (!) indicates a result that was not dispersed into the flowsheet. Document Creation Date: 09/06/2010 10:03 AM _______________________________________________________________________  (1) Order result status: Final Collection or observation date-time: 09/06/2010 09:58 Requested date-time:  Receipt date-time:  Reported date-time:  Referring  Physician:   Ordering Physician: Claudette Head [phone]) Specimen Source:  Source: Launa Grill Order Number: [id number] Lab site:   Appended Document: Colonoscopy    Clinical Lists Changes  Observations: Added new observation of COLONNXTDUE: 08/2015 (09/06/2010 16:54)

## 2010-11-16 ENCOUNTER — Other Ambulatory Visit: Payer: Self-pay | Admitting: Dermatology

## 2010-12-05 NOTE — Assessment & Plan Note (Signed)
[facility]                         GASTROENTEROLOGY OFFICE NOTE   NAME:MAHONEYIvan, Maskell                   MRN:          [id number]  DATE:10/08/2007                            DOB:          06-Feb-1936    Mrs. Dozier complains of frequent evening reflux symptoms. She notes a  fullness in her neck in the evening, particularly at bedtime. She has  had episodes of regurgitation as well. She notes her symptoms increase  after candy and chocolates. She notes no dysphagia or odynophagia. She  has occasional straining with bowel movements and notices small amounts  of bright red blood per rectum intermittently. She has known internal  and external hemorrhoids from prior evaluations. She has had no change  in stool caliber, weight loss, or change in bowel habits.   CURRENT MEDICATIONS:  Listed on the chart and reviewed.   MEDICATION ALLERGIES:  None known.   PHYSICAL EXAMINATION:  Anxious white female. Weight 174.4. Blood  pressure is 110/78. Pulse 64 and regular.  HEENT:  Anicteric sclerae. Oropharynx:  Clear.  CHEST:  Clear to auscultation bilaterally.  CARDIAC:  Regular rate and rhythm without murmur appreciated.  ABDOMEN:  Soft, nontender, nondistended, normoactive bowel sounds. No  palpable organomegaly, masses, or hernias.   ASSESSMENT AND PLAN:  1. Gastroesophageal reflux disease. Begin standard anti-reflux      measures and Zantac 150 mg p.o. q.p.m. If this is not effective      after several weeks, she can increase to Omeprazole 20 mg p.o.      q.p.m. taken before her evening meal. Return office visit in four      to six weeks if her symptoms are not under good control with the      above measures.  2. Internal and external hemorrhoids. Standard rectal care and      hemorrhoidal care instructions supplied. Begin Analpram HC cream      twice a day for 10 days. She may then use it p.r.n. She is to      return for followup in four to six weeks  if her symptoms have not      completely abated.  3. Personal history of adenomatous colon polyps. Recall colonoscopy      recommended for February 2010.     Venita Lick. Russella Dar, MD, [facility]  Electronically Signed    MTS/MedQ  DD: 10/08/2007  DT: 10/08/2007  Job #: [id number]   cc:   Caryn Bee L. Little, M.D.

## 2010-12-05 NOTE — Assessment & Plan Note (Signed)
[facility]                             PULMONARY OFFICE NOTE   NAME:MAHONEYLouis, Erica                   MRN:          [id number]  DATE:12/25/2006                            DOB:          01-31-1936    REFERRING PHYSICIAN:  Caryn Bee L. Little, M.D.   REASON FOR CONSULTATION:  Multiple pulmonary nodules.   HISTORY:  This is a delightful 75 year old white female never smoker,  seen here previously three years ago for evaluation of multiple nodules  that have been detected on multiple different CT scanners, but never on  a plain film.  She comes in again after a CT scan was done at yet  another facility, raising the possibility of metastatic lung disease.  Each time this done, it is done on a scanner of a different generation,  and so the question is whether there are any new nodules or whether they  are growing.  Clinically, she has no significant symptoms to correlate  with any of the nodules.  Specifically, she has no pleuritic pain,  significant dyspnea, fevers, chills, sweats, orthopnea, PND, myalgias,  arthralgias, or unintended weight loss.   PAST MEDICAL HISTORY:  1. Hypertension.  2. Previous cholecystectomy, hysterectomy, and appendectomy.   ALLERGIES:  None known.   MEDICATIONS:  Norvasc, Benicar, ginger root, and flaxseed oil.  For full  inventory with dosing, please see face sheet, dated December 25, 2006.   SOCIAL HISTORY:  She has never smoked.  She is retired, with no unusual  travel, pet, or hobby exposure.   FAMILY HISTORY:  Significant for breast cancer in her sister.  Negative  for respiratory diseases or rheumatism.   REVIEW OF SYSTEMS:  Taken in detail on the worksheet, and negative  except as outlined above.   PHYSICAL EXAMINATION:  GENERAL:  Anxious but quite pleasant ambulatory  white female in no acute distress.  VITAL SIGNS:  She is afebrile, with normal vital signs.  HEENT:  Unremarkable.  Pharynx clear.  NECK:   Supple, without cervical adenopathy or tenderness.  Trachea is  midline, no thyromegaly.  LUNG FIELDS:  Perfectly clear bilaterally to auscultation and  percussion.  HEART:  Regular rate and rhythm, without murmur, gallop, or rub.  ABDOMEN: Soft, benign.  EXTREMITIES: Warm, without calf tenderness, cyanosis, clubbing, or  edema.   Hemoglobin saturation 98% on room air.   Most recent CT scan was reviewed from Dec 09, 2006 from [facility] and  reveals multiple tiny pulmonary nodules, with minimal diffuse  bronchiectasis and no associated effusion.   IMPRESSION:  Multiple benign pulmonary nodules that may represent some  form of granulomatous disease.  However, since they basically have been  radiographically stable for now four years, and they appear to be  behaving in a benign fashion, the differential diagnosis includes  atypical mycobacterial infection (the absence of cough, fevers, chills,  sweats, purulent sputum, or definite progression by plain film argues  that if this is a mild form of atypical mycobacterial infection it does  not need to be treated at this point).  I think it is very unlikely that  she  could have miliary tuberculosis or metastatic cancer based on the  fact that she feels so healthy and that her disease has been so indolent  in that it still does not show up on a plain film.   Therefore, I would strongly recommend that CT scans not be done  serially, and if they are, always pick the same CT scan to compare to  the previous ones based on the problem of sampling and technical error  that is liable to occur in this particular setting.  My approach to this  type of patient is if the nodule does not appear on plain film it is  probably not worth pursuing since it is multifocal in nature, unless of  course clinical circumstances change, which I was assured, after  discussing with the patient today her history, is not the case.     Charlaine Dalton. Sherene Sires, MD, [facility]   Electronically Signed    MBW/MedQ  DD: 12/25/2006  DT: 12/26/2006  Job #: [id number]   cc:   Caryn Bee L. Little, M.D.

## 2010-12-08 NOTE — H&P (Signed)
Erica Simpson, Erica Simpson                      ACCOUNT NO.:  [id number]   MEDICAL RECORD NO.:  [id number]                   PATIENT TYPE:  AMB   LOCATION:  DAY                                  FACILITY[facility]   PHYSICIAN:  Gretta Cool, M.D.              DATE OF BIRTH:  01-Feb-1936   DATE OF ADMISSION:  07/07/2003  DATE OF DISCHARGE:                                HISTORY & PHYSICAL   CHIEF COMPLAINT:  Adnexal mass.   HISTORY OF PRESENT ILLNESS:  Erica Simpson has a left adnexal mass that is  complex in nature discovered by incidental finding at CT scan or followup of  polycystic kidneys.  Her initial study was done May 31, 2003.  Apparently, she has not previously had CT scan for assessment.  She  subsequently had ultrasound evaluation here in the office and was found to  have complex cyst confirmed measuring 6.3 x 4.1 on ultrasound.  The mass has  complex internal architecture with multiple loculations somewhat suspicious  for ovarian neoplasm, but could represent tube ovarian complex.  She has a  history of hysterectomy in 1989.  She did not have any postoperative  complications, infections or concerns.  That procedure was for high grade  dysplasia and uterine leiomyomata.  She has been entirely asymptomatic of  this mass.  Her CA-125 was 12.1.  Because of its complex character and her  concern over the presence of the mass, she is now admitted for diagnostic  laparoscopy, possibly laparotomy.  Dr. Stanford Breed is standing by should  she have an ovarian lesion for possible definitive therapy.   PAST MEDICAL HISTORY:  Usual childhood disease without sequela.   MEDICAL ILLNESSES:  The patient has polycystic kidneys with a family history  of same.  Father has renal failure on dialysis, also is diabetic.  Mother  died of myocardial infarction at advanced age of 31.  One sister also has  polycystic kidneys, also had breast cancer.  She does have a remote history  of  exploratory laparotomy for blocked tube, she does not remember which, in  1969. Also, history of cholecystectomy, appendectomy, kidney stone and  hysterectomy, both in 1989.   PRESENT MEDICATIONS:  1. Altace 5 mg b.i.d.  2. Norvasc 5 mg daily.  3. Actonel 35 mg weekly for osteoporosis.   HABITS:  Denies tobacco, recreational drugs, occasional ethanol.   SOCIAL HISTORY:  Widowed with one living child age 75.  The patient is  retired.   REVIEW OF SYSTEMS:  HEENT: Without symptoms.  CARDIORESPIRATORY:  Denies  asthma, cough, or shortness of breath.  GI/GU:  Denies urgency, frequency,  dysuria, change in bowel habits, food intolerance.  Polycystic kidneys as  above.   PHYSICAL EXAMINATION:  GENERAL:  Well-developed, well-nourished white  female.Marland Kitchen  HEENT:  Pupils equal, reactive to light and accommodate.  Fundi not  examined.  Oropharynx clear.  NECK:  Supple without masses or enlargement.  CHEST:  Clear percussion and auscultation.  HEART:  Regular rhythm without murmur, cardiac enlargement.  BREASTS:  Soft without masse.  No nipple discharge.  ABDOMEN:  Soft, scaphoid without mass, organomegaly.  PELVIC:  External genitalia:  Normal female.  Vagina thin and atrophic.  Cervix and uterus surgically absent.  Adnexal nonpalpable.  Rectovaginal exam confirms.   IMPRESSION:  Adnexal mass, complex cystic suspicious of adnexal pathology.  Rule out ovarian neoplasm.   PLAN:  Diagnostic laparoscopy and attempted EndoCatch removal via the  laparoscope versus laparotomy and definitive therapy, Dr. Stanford Breed  standing by for the possibility of malignancy.                                               Gretta Cool, M.D.    CWL/MEDQ  D:  07/07/2003  T:  07/07/2003  Job:  [id number]   cc:   Barry Dienes. Eloise Harman, M.D.  [address]  [place]  [place] [address]  Fax: [phone]   Rozanna Boer., M.D.  [address]  [place]  [place] [address]  Fax: [phone]    Anna Genre. Little, M.D.  [address]  [place]  [place] [address]  Fax: [phone]

## 2010-12-08 NOTE — Op Note (Signed)
NAME:  Erica Simpson, Erica Simpson                      ACCOUNT NO.:  [id number]   MEDICAL RECORD NO.:  [id number]                   PATIENT TYPE:  AMB   LOCATION:  DAY                                  FACILITY:  [facility]   PHYSICIAN:  Gretta Cool, M.D.              DATE OF BIRTH:  07/29/1935   DATE OF PROCEDURE:  07/07/2003  DATE OF DISCHARGE:                                 OPERATIVE REPORT   PREOPERATIVE DIAGNOSES:  Right adnexal mass, complex cystic, rule out  malignancy.   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   PROCEDURE:  1. Diagnostic laparoscopy.  2. Bilateral salpingo-oophorectomy.  3. EndoCatch removal of the right ovarian tumor.  4. Frozen section excision and removal of left ovary and tube.   SURGEON:  Gretta Cool, M.D.   ASSISTANT:  Raynald Kemp, M.D.   ANESTHESIA:  General orotracheal.   DESCRIPTION OF PROCEDURE:  Under excellent general anesthesia with the  patient prepped and draped in Allen stirrups, a subumbilical incision was  made and extended through the fascia.  Peritoneum was then opened and a  Hassan open laparoscopy speculum placed.  The peritoneal cavity was then  examined.  There were multiple adhesions from previous appendectomy and  cholecystectomy, but none that involved placement of trocars.  Accessory  ports were placed 10 mm in the right lower quadrant lateral to the inferior  epigastric and 5 mm on the left.  The adhesions about the fallopian tubes  and ovaries were then lysed by tripolar forceps and scissors.  A grasping  forceps was then used to adequately mobilize the pedicle and the pedicle  transected by tripolar forceps.  The right ovarian mass was then completely  excised.  Note, it was approximately 4 x 6 cm with multiple loculations.  There were no external excrescences or evidence of malignancy or extant  disease in the pelvis, on the peritoneal surfaces, or surface of the liver,  diaphragm.  Washings were obtained prior to beginning the procedure and  submitted separately.  Once the right ovary was excised it was placed in an  EndoCatch and the EndoCatch bag delivered through the peritoneal cavity.  Next, the cystic loculations were aspirated as far as possible and then the  fascial incision extended a bit by Kelly clamp and the EndoCatch bag  delivered intact.  The specimens were then submitted to the pathologist for  frozen section.  Frozen section revealed a benign adenofibroma, no evidence  of malignancy by clinical or by microscopic grounds.  The left ovary was  then subsequently removed by tripolar forceps as well and submitted also.  It also contained adenofibromatous components.  At this point the pelvis was  irrigated with lactated Ringer's, the gas allowed to escape, incisions  closed with deep suture of 0 Vicryl.  The right lateral port site fascia was  closed with a running suture of 0 Vicryl.  The umbilical port sites were  closed with interrupted sutures of 0 Vicryl.  The skin of all port sites  were closed with subcuticular closure of 5-0 Vicryl and Steri-Strips.  At  the end of the procedure the sponge and lap counts were correct.  There were  no complications.  The patient returned to recovery room in excellent  condition.                                               Gretta Cool, M.D.    CWL/MEDQ  D:  07/08/2003  T:  07/08/2003  Job:  [id number]   cc:   Wilber Bihari. Caryn Section, M.D.  [address]  [place]  [place] [address]  Fax: [phone]   Clarene Duke, M.D.  [facility] Shelle Iron., M.D.  [address]  [place]  [place] [address]  Fax: [phone]

## 2011-02-13 ENCOUNTER — Other Ambulatory Visit: Payer: Self-pay | Admitting: Gastroenterology

## 2011-02-14 MED ORDER — OMEPRAZOLE 40 MG PO CPDR: 40.0000 mg | DELAYED_RELEASE_CAPSULE | Freq: Every day | ORAL | Status: DC

## 2011-02-14 NOTE — Telephone Encounter (Signed)
Mailed the prescription to patient's home and notified patient.

## 2011-04-11 ENCOUNTER — Other Ambulatory Visit (HOSPITAL_COMMUNITY): Payer: Self-pay | Admitting: Gynecology

## 2011-04-11 DIAGNOSIS — Z1231 Encounter for screening mammogram for malignant neoplasm of breast: Secondary | ICD-10-CM

## 2011-05-31 ENCOUNTER — Ambulatory Visit (HOSPITAL_COMMUNITY)
Admission: RE | Admit: 2011-05-31 | Discharge: 2011-05-31 | Disposition: A | Discharge status: Home / Self Care | Payer: Medicare Other | Source: Ambulatory Visit | Attending: Gynecology | Admitting: Gynecology

## 2011-05-31 DIAGNOSIS — Z1231 Encounter for screening mammogram for malignant neoplasm of breast: Secondary | ICD-10-CM | POA: Insufficient documentation

## 2011-07-25 ENCOUNTER — Telehealth: Payer: Self-pay | Admitting: Gastroenterology

## 2011-07-25 MED ORDER — OMEPRAZOLE 40 MG PO CPDR: 40.0000 mg | DELAYED_RELEASE_CAPSULE | Freq: Every day | ORAL | Status: DC

## 2011-07-25 NOTE — Telephone Encounter (Signed)
Left a message for patient to return my call. 

## 2011-07-25 NOTE — Telephone Encounter (Signed)
Patient called back and states she needs her prescription sent to [phone] for her mail order pharmacy. Told patient she needs to schedule a office visit for any more refills to her mail order. Pt scheduled a follow up appt.

## 2011-08-15 ENCOUNTER — Other Ambulatory Visit: Payer: Self-pay | Admitting: Dermatology

## 2011-08-15 ENCOUNTER — Ambulatory Visit
Admission: RE | Admit: 2011-08-15 | Discharge: 2011-08-15 | Disposition: A | Discharge status: Home / Self Care | Payer: Medicare Other | Source: Ambulatory Visit | Attending: Dermatology | Admitting: Dermatology

## 2011-08-15 DIAGNOSIS — L929 Granulomatous disorder of the skin and subcutaneous tissue, unspecified: Secondary | ICD-10-CM

## 2011-08-16 ENCOUNTER — Ambulatory Visit: Payer: Medicare Other | Admitting: Gastroenterology

## 2011-08-30 ENCOUNTER — Ambulatory Visit (INDEPENDENT_AMBULATORY_CARE_PROVIDER_SITE_OTHER): Payer: Medicare Other | Admitting: Gastroenterology

## 2011-08-30 ENCOUNTER — Encounter: Payer: Self-pay | Admitting: Gastroenterology

## 2011-08-30 VITALS — BP 130/70 | HR 75 | Ht 64.0 in | Wt 167.0 lb

## 2011-08-30 DIAGNOSIS — K219 Gastro-esophageal reflux disease without esophagitis: Secondary | ICD-10-CM

## 2011-08-30 DIAGNOSIS — Z86010 Personal history of colonic polyps: Secondary | ICD-10-CM

## 2011-08-30 MED ORDER — OMEPRAZOLE 40 MG PO CPDR: 40.0000 mg | DELAYED_RELEASE_CAPSULE | Freq: Every day | ORAL | Status: DC

## 2011-08-30 NOTE — Progress Notes (Signed)
History of Present Illness: This is a 76 year old female with GERD is well controlled on omeprazole 40 mg daily. Is no active reflux symptoms. She underwent colonoscopy February 2012 followup of adenomatous. Occasionally she has some minor rectal bleeding with hard or large bowel movements attributed to internal hemorrhoids. Denies weight loss, abdominal pain, diarrhea, change in stool caliber, melena, nausea, vomiting, dysphagia, reflux symptoms, chest pain.  Current Medications, Allergies, Past Medical History, Past Surgical History, Family History and Social History were reviewed in [employer] record.  Physical Exam: General: Well developed , well nourished, no acute distress Head: Normocephalic and atraumatic Eyes:  sclerae anicteric, EOMI Ears: Normal auditory acuity Mouth: No deformity or lesions Lungs: Clear throughout to auscultation Heart: Regular rate and rhythm; no murmurs, rubs or bruits Abdomen: Soft, non tender and non distended. No masses, hepatosplenomegaly or hernias noted. Normal Bowel sounds Musculoskeletal: Symmetrical with no gross deformities  Extremities: No clubbing, cyanosis, edema or deformities noted Neurological: Alert oriented x 4, grossly nonfocal Psychological:  Alert and cooperative. Normal mood and affect  Assessment and Recommendations:  1. GERD. Continue omeprazole 40 mg daily in standard and reflux measures.  2. Personal history of tubulovillous adenomatous colon polyp in 2002. Surveillance colonoscopy recommended in February 2017.  3.  Internal hemorrhoids with occasional minor bleeding. Increase dietary fiber and water intake. May use Preparation H suppositories daily as needed.

## 2011-08-30 NOTE — Patient Instructions (Signed)
Your prescription for omeprazole has been sent to your [facility] mail order pharmacy.  Follow up in one year with Dr. Russella Dar.  cc: Catha Gosselin, MD

## 2012-02-18 ENCOUNTER — Other Ambulatory Visit (HOSPITAL_COMMUNITY): Payer: Self-pay | Admitting: Family Medicine

## 2012-02-18 DIAGNOSIS — Z1231 Encounter for screening mammogram for malignant neoplasm of breast: Secondary | ICD-10-CM

## 2012-06-02 ENCOUNTER — Ambulatory Visit (HOSPITAL_COMMUNITY)
Admission: RE | Admit: 2012-06-02 | Discharge: 2012-06-02 | Disposition: A | Discharge status: Home / Self Care | Payer: Medicare Other | Source: Ambulatory Visit | Attending: Family Medicine | Admitting: Family Medicine

## 2012-06-02 DIAGNOSIS — Z1231 Encounter for screening mammogram for malignant neoplasm of breast: Secondary | ICD-10-CM | POA: Insufficient documentation

## 2012-09-12 ENCOUNTER — Telehealth: Payer: Self-pay | Admitting: Gastroenterology

## 2012-09-12 NOTE — Telephone Encounter (Signed)
Patient's son notes that his mother was diagnosed this am with diverticulitis at her primary care.  He wants to discuss diverticulitis and concerns.  He is advised that she should remain on a liquid diet until her pain has improved then can slowly advance to a low residue diet for a few days.  Once her pain has resolved she can resume a normal dit.  She should call if her pain is not completely resolved with cipro and flagyl.  She will call back for a fever or new concerns.

## 2012-09-15 ENCOUNTER — Telehealth: Payer: Self-pay | Admitting: Gastroenterology

## 2012-09-15 NOTE — Telephone Encounter (Signed)
Left message for patient to call back  

## 2012-09-15 NOTE — Telephone Encounter (Signed)
Patient was started on cipro and flagyl for diverticulitis on Friday by her primary care.  She is feeling better and wants to know does she need to complete the antibiotics.  She is advised that she should take all of the doses of antibiotics.  She is asked to call back if her symptoms don't completely resolve

## 2013-04-28 ENCOUNTER — Other Ambulatory Visit (HOSPITAL_COMMUNITY): Payer: Self-pay | Admitting: Family Medicine

## 2013-04-28 DIAGNOSIS — Z1231 Encounter for screening mammogram for malignant neoplasm of breast: Secondary | ICD-10-CM

## 2013-06-04 ENCOUNTER — Ambulatory Visit (HOSPITAL_COMMUNITY)
Admission: RE | Admit: 2013-06-04 | Discharge: 2013-06-04 | Disposition: A | Discharge status: Home / Self Care | Payer: Medicare Other | Source: Ambulatory Visit | Attending: Family Medicine | Admitting: Family Medicine

## 2013-06-04 DIAGNOSIS — Z1231 Encounter for screening mammogram for malignant neoplasm of breast: Secondary | ICD-10-CM

## 2014-05-04 ENCOUNTER — Other Ambulatory Visit (HOSPITAL_COMMUNITY): Payer: Self-pay | Admitting: Family Medicine

## 2014-05-04 DIAGNOSIS — Z1231 Encounter for screening mammogram for malignant neoplasm of breast: Secondary | ICD-10-CM

## 2014-06-09 ENCOUNTER — Ambulatory Visit (HOSPITAL_COMMUNITY)
Admission: RE | Admit: 2014-06-09 | Discharge: 2014-06-09 | Disposition: A | Discharge status: Home / Self Care | Payer: Medicare Other | Source: Ambulatory Visit | Attending: Family Medicine | Admitting: Family Medicine

## 2014-06-09 DIAGNOSIS — Z1231 Encounter for screening mammogram for malignant neoplasm of breast: Secondary | ICD-10-CM | POA: Insufficient documentation

## 2014-10-05 ENCOUNTER — Ambulatory Visit (INDEPENDENT_AMBULATORY_CARE_PROVIDER_SITE_OTHER): Payer: Medicare Other | Admitting: Gastroenterology

## 2014-10-05 ENCOUNTER — Encounter: Payer: Self-pay | Admitting: Gastroenterology

## 2014-10-05 VITALS — BP 112/70 | HR 68 | Ht 63.5 in | Wt 156.5 lb

## 2014-10-05 DIAGNOSIS — Z86010 Personal history of colonic polyps: Secondary | ICD-10-CM | POA: Diagnosis not present

## 2014-10-05 DIAGNOSIS — K219 Gastro-esophageal reflux disease without esophagitis: Secondary | ICD-10-CM | POA: Diagnosis not present

## 2014-10-05 DIAGNOSIS — K5900 Constipation, unspecified: Secondary | ICD-10-CM

## 2014-10-05 MED ORDER — RANITIDINE HCL 300 MG PO CAPS: 300.0000 mg | ORAL_CAPSULE | Freq: Two times a day (BID) | ORAL | Status: DC

## 2014-10-05 NOTE — Progress Notes (Signed)
History of Present Illness: This is a 79 year old female with a history of GERD. Her reflux symptoms were well controlled on a daily PPI. She has polycystic kidney disease and recently heard about a report of PPIs associated with kidney disease so she discontinued her PPI. She did well for 6-8 weeks however recently she has noted frequent reflux symptoms and a lump in the throat sensation. She reports mild intermittent constipation but is easily treated with over-the-counter Dulcolax.  No Known Allergies Outpatient Prescriptions Prior to Visit  Medication Sig Dispense Refill  . amLODipine (NORVASC) 5 MG tablet Take 5 mg by mouth daily.    . Cholecalciferol (VITAMIN D3) 2000 UNITS TABS Take 1 tablet by mouth daily.    Marland Kitchen loratadine (CLARITIN) 10 MG tablet Take 10 mg by mouth daily.    . psyllium (METAMUCIL SMOOTH TEXTURE) 28 % packet Take 1 packet by mouth daily.    Marland Kitchen omeprazole (PRILOSEC) 40 MG capsule Take 1 capsule (40 mg total) by mouth daily. 90 capsule 3  . ramipril (ALTACE) 5 MG capsule Take 5 mg by mouth 2 (two) times daily.     No facility-administered medications prior to visit.   Past Medical History  Diagnosis Date  . Adenomatous colon polyp 02/2001  . Diverticulosis   . Internal hemorrhoids   . Polycystic kidney disease   . GERD (gastroesophageal reflux disease)   . Hypertension   . Lichen planus    Past Surgical History  Procedure Laterality Date  . Oophorectomy    . Appendectomy    . Cholecystectomy    . Partial hysterectomy     History   Social History  . Marital Status: Widowed    Spouse Name: N/A  . Number of Children: 1  . Years of Education: N/A   Occupational History  . Retired    Social History Main Topics  . Smoking status: Never Smoker   . Smokeless tobacco: Never Used  . Alcohol Use: Yes     Comment: occ.  . Drug Use: No  . Sexual Activity: Not on file   Other Topics Concern  . None   Social History Narrative   Caffeine 3 cups of  coffee a day   Family History  Problem Relation Age of Onset  . Breast cancer Sister   . Heart disease Mother   . Colon cancer Neg Hx   . Kidney disease Mother   . Kidney disease Father        Review of Systems: Pertinent positive and negative review of systems were noted in the above HPI section. All other review of systems were otherwise negative.   Physical Exam: General: Well developed, well nourished, no acute distress Head: Normocephalic and atraumatic Eyes:  sclerae anicteric, EOMI Ears: Normal auditory acuity Mouth: No deformity or lesions Neck: Supple, no masses or thyromegaly Lungs: Clear throughout to auscultation Heart: Regular rate and rhythm; no murmurs, rubs or bruits Abdomen: Soft, non tender and non distended. No masses, hepatosplenomegaly or hernias noted. Normal Bowel sounds Musculoskeletal: Symmetrical with no gross deformities  Skin: No lesions on visible extremities Pulses:  Normal pulses noted Extremities: No clubbing, cyanosis, edema or deformities noted Neurological: Alert oriented x 4, grossly nonfocal Cervical Nodes:  No significant cervical adenopathy Inguinal Nodes: No significant inguinal adenopathy Psychological:  Alert and cooperative. Normal mood and affect  Assessment and Recommendations:  1. GERD. Begin ranitidine 300 mg twice a day and standard antireflux measures. We discussed the recent report of PPIs  associated with kidney disease which does not prove cause-and-effect. She is advised to contact me in 6-8 weeks if her GERD symptoms are not adequately controlled. REV in 1 year.  2. Personal history of tubulovillous adenomatous colon polyp in 2002. Surveillance colonoscopy recommended in February 2017.  3.Mild constipation. Dulcolax as needed.

## 2014-10-05 NOTE — Patient Instructions (Signed)
We have sent the following medications to your pharmacy for you to pick up at your convenience:ranitidine.  You will be due for a recall colonoscopy in 08/2015. We will send you a reminder in the mail when it gets closer to that time.  Thank you for choosing me and [facility].  Pricilla Riffle. Dagoberto Ligas., MD., [facility] Regal

## 2015-05-10 ENCOUNTER — Other Ambulatory Visit: Payer: Self-pay

## 2015-05-10 DIAGNOSIS — Z1231 Encounter for screening mammogram for malignant neoplasm of breast: Secondary | ICD-10-CM

## 2015-06-20 ENCOUNTER — Ambulatory Visit
Admission: RE | Admit: 2015-06-20 | Discharge: 2015-06-20 | Disposition: A | Discharge status: Home / Self Care | Payer: Medicare Other | Source: Ambulatory Visit

## 2015-06-20 DIAGNOSIS — Z1231 Encounter for screening mammogram for malignant neoplasm of breast: Secondary | ICD-10-CM

## 2015-07-06 ENCOUNTER — Ambulatory Visit: Payer: Federal, State, Local not specified - PPO | Admitting: Gastroenterology

## 2015-07-08 ENCOUNTER — Encounter: Payer: Self-pay | Admitting: Gastroenterology

## 2015-08-24 ENCOUNTER — Ambulatory Visit (INDEPENDENT_AMBULATORY_CARE_PROVIDER_SITE_OTHER): Payer: Medicare Other | Admitting: Gastroenterology

## 2015-08-24 ENCOUNTER — Encounter: Payer: Self-pay | Admitting: Gastroenterology

## 2015-08-24 ENCOUNTER — Telehealth: Payer: Self-pay | Admitting: Gastroenterology

## 2015-08-24 VITALS — BP 110/70 | HR 84 | Ht 63.5 in | Wt 147.4 lb

## 2015-08-24 DIAGNOSIS — Z86010 Personal history of colonic polyps: Secondary | ICD-10-CM

## 2015-08-24 DIAGNOSIS — K219 Gastro-esophageal reflux disease without esophagitis: Secondary | ICD-10-CM

## 2015-08-24 DIAGNOSIS — D509 Iron deficiency anemia, unspecified: Secondary | ICD-10-CM

## 2015-08-24 MED ORDER — NA SULFATE-K SULFATE-MG SULF 17.5-3.13-1.6 GM/177ML PO SOLN: 1.0000 | Freq: Once | ORAL | Status: DC

## 2015-08-24 NOTE — Progress Notes (Signed)
    History of Present Illness: This is a 80 year old female who relates problems with worsening reflux symptoms and a recently diagnosed iron deficiency anemia. She was seen in follow-up by her nephrologist a couple weeks ago. Iron studies showed a 12% saturation and the TIBC was low. Her ferritin 8 and hemoglobin 10. Her reflux symptoms were not well controlled on ranitidine 300 mg twice daily and she was changed to omeprazole 40 mg daily with improved reflux control about 1 week ago. She states her energy level has been poor for several weeks and her appetite has been depressed for since she saw her nephrologist. Denies weight loss, abdominal pain, constipation, diarrhea, change in stool caliber, melena, hematochezia, nausea, vomiting, dysphagia, chest pain.  Current Medications, Allergies, Past Medical History, Past Surgical History, Family History and Social History were reviewed in [employer] record.  Physical Exam: General: Well developed, well nourished, no acute distress Head: Normocephalic and atraumatic Eyes:  sclerae anicteric, EOMI Ears: Normal auditory acuity Mouth: No deformity or lesions Lungs: Clear throughout to auscultation Heart: Regular rate and rhythm; no murmurs, rubs or bruits Abdomen: Soft, non tender and non distended. No masses, hepatosplenomegaly or hernias noted. Normal Bowel sounds Musculoskeletal: Symmetrical with no gross deformities  Pulses:  Normal pulses noted Extremities: No clubbing, cyanosis, edema or deformities noted Neurological: Alert oriented x 4, grossly nonfocal Psychological:  Alert and cooperative. Anxious  Assessment and Recommendations:  1. GERD. Not adequately controlled on ranitidine 300 mg twice a day and standard antireflux measures. Changed to omperazole 40 mg daily with improved control. Closely follow all standard antireflux measures. Given new anemia will proceed with further evaluation with EGD.  2. Personal  history of tubulovillous adenomatous colon polyp in 2002. Surveillance colonoscopy is due. The risks (including bleeding, perforation, infection, missed lesions, medication reactions and possible hospitalization or surgery if complications occur), benefits, and alternatives to colonoscopy with possible biopsy and possible polypectomy were discussed with the patient and they consent to proceed.    3.Iron deficiency anemia. R/O occult GI blood loss or less likely celiac disease. Colonoscopy and EGD. The risks (including bleeding, perforation, infection, missed lesions, medication reactions and possible hospitalization or surgery if complications occur), benefits, and alternatives to endoscopy with possible biopsy and possible dilation were discussed with the patient and they consent to proceed.

## 2015-08-24 NOTE — Patient Instructions (Signed)
Stay on omeprazole 40 mg daily.   Patient advised to avoid spicy, acidic, citrus, chocolate, mints, fruit and fruit juices.  Limit the intake of caffeine, alcohol and Soda.  Don't exercise too soon after eating.  Don't lie down within 3-4 hours of eating.  Elevate the head of your bed.  You have been scheduled for an endoscopy and colonoscopy. Please follow the written instructions given to you at your visit today. Please pick up your prep supplies at the pharmacy within the next 1-3 days. If you use inhalers (even only as needed), please bring them with you on the day of your procedure. Your physician has requested that you go to www.startemmi.com and enter the access code given to you at your visit today. This web site gives a general overview about your procedure. However, you should still follow specific instructions given to you by our office regarding your preparation for the procedure.  Thank you for choosing me and [facility].  Pricilla Riffle. Dagoberto Ligas., MD., [facility] Regal

## 2015-08-24 NOTE — Telephone Encounter (Signed)
Son called about this patient this evening. She drank the bowel prep for her procedure tomorrow but vomited it up entirely, and they have no prep left. She is scheduled for EGD / colonoscopy tomorrow morning. She used Suprep. I offered a Miralax prep for the patient tonight but she did not think she could drink any more and wants to cancel the colonoscopy for tomorrow. She was still interested in completing the EGD however. I discussed that I will relay this to Dr. Fuller Plan and the endoscopy staff to see if they wish to have just the EGD tomorrow or have her rescheduled for both to limit the amount of times she is sedated for this. Patient prefers to be contacted at [phone].

## 2015-08-24 NOTE — Telephone Encounter (Signed)
Please come for EGD on 2/2. We will reschedule colonoscopy.

## 2015-08-25 ENCOUNTER — Ambulatory Visit (AMBULATORY_SURGERY_CENTER): Payer: Medicare Other | Admitting: Gastroenterology

## 2015-08-25 ENCOUNTER — Other Ambulatory Visit: Payer: Self-pay | Admitting: Gastroenterology

## 2015-08-25 ENCOUNTER — Other Ambulatory Visit (INDEPENDENT_AMBULATORY_CARE_PROVIDER_SITE_OTHER): Payer: Medicare Other

## 2015-08-25 ENCOUNTER — Encounter: Payer: Self-pay | Admitting: Gastroenterology

## 2015-08-25 VITALS — BP 123/67 | HR 76 | Temp 97.4°F | Resp 19 | Ht 63.0 in | Wt 141.0 lb

## 2015-08-25 DIAGNOSIS — R933 Abnormal findings on diagnostic imaging of other parts of digestive tract: Secondary | ICD-10-CM

## 2015-08-25 DIAGNOSIS — K219 Gastro-esophageal reflux disease without esophagitis: Secondary | ICD-10-CM

## 2015-08-25 DIAGNOSIS — K3189 Other diseases of stomach and duodenum: Secondary | ICD-10-CM

## 2015-08-25 DIAGNOSIS — D509 Iron deficiency anemia, unspecified: Secondary | ICD-10-CM

## 2015-08-25 LAB — CREATININE, SERUM: CREATININE: 2.57 mg/dL — AB (ref 0.40–1.20)

## 2015-08-25 LAB — BUN: BUN: 33 mg/dL — AB (ref 6–23)

## 2015-08-25 MED ORDER — SODIUM CHLORIDE 0.9 % IV SOLN: 500.0000 mL | INTRAVENOUS | Status: DC

## 2015-08-25 NOTE — Op Note (Signed)
[place]  [address] [place], [address]   ENDOSCOPY PROCEDURE REPORT  PATIENT: Erica Simpson, Erica Simpson  MR#: [id number] BIRTHDATE: 1936/02/06 , 66  yrs. old GENDER: female ENDOSCOPIST: Ladene Artist, MD, [facility] REFERRED BY:  Hulan Fess, M.D., Ruffin Frederick, M.D. PROCEDURE DATE:  08/25/2015 PROCEDURE:  EGD w/ biopsy ASA CLASS:     Class II INDICATIONS:  iron deficiency anemia. MEDICATIONS: Monitored anesthesia care and Propofol 200 mg IV TOPICAL ANESTHETIC: none DESCRIPTION OF PROCEDURE: After the risks benefits and alternatives of the procedure were thoroughly explained, informed consent was obtained.  The [id number] endoscope was introduced through the mouth and advanced to the second portion of the duodenum , Without limitations.  The instrument was slowly withdrawn as the mucosa was fully examined.    DUODENUM: A 3-4 cm near circumferential, firm, partially obstructing and ulcerated mass was found in the 3rd part of the duodenum. There was a diverticulum near the distal margin of the mass. I was able to traverse the mass. Multiple biopsies were performed. The duodenal mucosa showed no abnormalities in the bulb and 2nd part of the duodenum. STOMACH: The mucosa of the stomach appeared normal. ESOPHAGUS: There was a benign appearing stricture at the gastroesophageal junction.  The stricture was easily traversable. Retroflexed views revealed a small hiatal hernia.   The scope was then withdrawn from the patient and the procedure completed.  COMPLICATIONS: There were no immediate complications.  ENDOSCOPIC IMPRESSION: 1.   Ulcerated mass in the 3rd part of the duodenum; multiple biopsies performed 2.   Small hiatal hernia 3.   Stricture at the gastroesophageal junction  RECOMMENDATIONS: 1.  Await pathology results 2.  CT scan of your abdomen/pelvis 3.  Surgical consult 4.  Continue PPI qd 5.  Soft diet  eSigned:  Ladene Artist, MD,  [facility] 08/25/2015 11:49 AM

## 2015-08-25 NOTE — Patient Instructions (Addendum)
YOU HAD AN ENDOSCOPIC PROCEDURE TODAY AT [facility]:   Refer to the procedure report that was given to you for any specific questions about what was found during the examination.  If the procedure report does not answer your questions, please call your gastroenterologist to clarify.  If you requested that your care partner not be given the details of your procedure findings, then the procedure report has been included in a sealed envelope for you to review at your convenience later.  YOU SHOULD EXPECT: Some feelings of bloating in the abdomen. Passage of more gas than usual.  Walking can help get rid of the air that was put into your GI tract during the procedure and reduce the bloating. If you had a lower endoscopy (such as a colonoscopy or flexible sigmoidoscopy) you may notice spotting of blood in your stool or on the toilet paper. If you underwent a bowel prep for your procedure, you may not have a normal bowel movement for a few days.  Please Note:  You might notice some irritation and congestion in your nose or some drainage.  This is from the oxygen used during your procedure.  There is no need for concern and it should clear up in a day or so.  SYMPTOMS TO REPORT IMMEDIATELY:    Following upper endoscopy (EGD)  Vomiting of blood or coffee ground material  New chest pain or pain under the shoulder blades  Painful or persistently difficult swallowing  New shortness of breath  Fever of 100F or higher  Black, tarry-looking stools  For urgent or emergent issues, a gastroenterologist can be reached at any hour by calling [phone].   DIET: Your first meal following the procedure should be a small meal and then it is ok to progress to your normal diet. Heavy or fried foods are harder to digest and may make you feel nauseous or bloated.  Likewise, meals heavy in dairy and vegetables can increase bloating.  Drink plenty of fluids but you should avoid alcoholic beverages  for 24 hours.  ACTIVITY:  You should plan to take it easy for the rest of today and you should NOT DRIVE or use heavy machinery until tomorrow (because of the sedation medicines used during the test).    FOLLOW UP: Our staff will call the number listed on your records the next business day following your procedure to check on you and address any questions or concerns that you may have regarding the information given to you following your procedure. If we do not reach you, we will leave a message.  However, if you are feeling well and you are not experiencing any problems, there is no need to return our call.  We will assume that you have returned to your regular daily activities without incident.  If any biopsies were taken you will be contacted by phone or by letter within the next 1-3 weeks.  Please call us at [phone] if you have not heard about the biopsies in 3 weeks.    SIGNATURES/CONFIDENTIALITY: You and/or your care partner have signed paperwork which will be entered into your electronic medical record.  These signatures attest to the fact that that the information above on your After Visit Summary has been reviewed and is understood.  Full responsibility of the confidentiality of this discharge information lies with you and/or your care-partner.  Await biopsy results. Contrast given and instructions for CT scan provided. Soft diet

## 2015-08-25 NOTE — Progress Notes (Addendum)
Called to room to assist during endoscopic procedure.  Patient ID and intended procedure confirmed with present staff. Received instructions for my participation in the procedure from the performing physician.  Specimen sent stat. 

## 2015-08-25 NOTE — Progress Notes (Signed)
Scheduled CT scan for 08/26/15 at 9:00am. Made patient aware to drink contrast at 7:00am and 8:00am. Also to be npo 4 hours prior to test. Pt verbalized understanding.

## 2015-08-25 NOTE — Telephone Encounter (Signed)
Spoke with pt and she is agreeable to doing her EGD only.  Reviewed instructions with her- NPO after 800, arrive at 1000 for an 1100 procedure.  Understanding voiced

## 2015-08-25 NOTE — Progress Notes (Signed)
Report to PACU, RN, vss, BBS= Clear.  

## 2015-08-26 ENCOUNTER — Telehealth: Payer: Self-pay | Admitting: Gastroenterology

## 2015-08-26 ENCOUNTER — Ambulatory Visit (INDEPENDENT_AMBULATORY_CARE_PROVIDER_SITE_OTHER)
Admission: RE | Admit: 2015-08-26 | Discharge: 2015-08-26 | Disposition: A | Discharge status: Home / Self Care | Payer: Medicare Other | Source: Ambulatory Visit | Attending: Gastroenterology | Admitting: Gastroenterology

## 2015-08-26 ENCOUNTER — Telehealth: Payer: Self-pay | Admitting: *Deleted

## 2015-08-26 DIAGNOSIS — R933 Abnormal findings on diagnostic imaging of other parts of digestive tract: Secondary | ICD-10-CM | POA: Diagnosis not present

## 2015-08-26 DIAGNOSIS — K3189 Other diseases of stomach and duodenum: Secondary | ICD-10-CM | POA: Diagnosis not present

## 2015-08-26 MED ORDER — ONDANSETRON HCL 4 MG PO TABS: 4.0000 mg | ORAL_TABLET | Freq: Three times a day (TID) | ORAL | Status: DC | PRN

## 2015-08-26 NOTE — Telephone Encounter (Signed)
Patient advised that the results are not available at this time.   She reports nausea and vomiting.  She has no appetite  Discussed with Dr. Hilarie Fredrickson.  Zofran 4 mg 1 po q 4 hours as needed  Patient notified.  She is advised that we will call back on Monday with the results.

## 2015-08-26 NOTE — Telephone Encounter (Signed)
Left message that we called for f/u 

## 2015-08-27 ENCOUNTER — Encounter (HOSPITAL_COMMUNITY): Payer: Self-pay | Admitting: *Deleted

## 2015-08-27 ENCOUNTER — Emergency Department (HOSPITAL_COMMUNITY)
Admission: EM | Admit: 2015-08-27 | Discharge: 2015-08-27 | Disposition: A | Discharge status: Home / Self Care | Payer: Medicare Other | Attending: Emergency Medicine | Admitting: Emergency Medicine

## 2015-08-27 DIAGNOSIS — Z86010 Personal history of colonic polyps: Secondary | ICD-10-CM | POA: Insufficient documentation

## 2015-08-27 DIAGNOSIS — C170 Malignant neoplasm of duodenum: Secondary | ICD-10-CM

## 2015-08-27 DIAGNOSIS — R1110 Vomiting, unspecified: Secondary | ICD-10-CM | POA: Diagnosis present

## 2015-08-27 DIAGNOSIS — I10 Essential (primary) hypertension: Secondary | ICD-10-CM | POA: Diagnosis not present

## 2015-08-27 DIAGNOSIS — K219 Gastro-esophageal reflux disease without esophagitis: Secondary | ICD-10-CM | POA: Diagnosis not present

## 2015-08-27 DIAGNOSIS — Z872 Personal history of diseases of the skin and subcutaneous tissue: Secondary | ICD-10-CM | POA: Insufficient documentation

## 2015-08-27 DIAGNOSIS — Z79899 Other long term (current) drug therapy: Secondary | ICD-10-CM | POA: Diagnosis not present

## 2015-08-27 DIAGNOSIS — Q613 Polycystic kidney, unspecified: Secondary | ICD-10-CM | POA: Diagnosis not present

## 2015-08-27 LAB — COMPREHENSIVE METABOLIC PANEL
ALT: 49 U/L (ref 14–54)
AST: 65 U/L — ABNORMAL HIGH (ref 15–41)
Albumin: 3.4 g/dL — ABNORMAL LOW (ref 3.5–5.0)
Alkaline Phosphatase: 255 U/L — ABNORMAL HIGH (ref 38–126)
Anion gap: 12 (ref 5–15)
BUN: 29 mg/dL — ABNORMAL HIGH (ref 6–20)
CO2: 18 mmol/L — ABNORMAL LOW (ref 22–32)
Calcium: 8.7 mg/dL — ABNORMAL LOW (ref 8.9–10.3)
Chloride: 110 mmol/L (ref 101–111)
Creatinine, Ser: 2.63 mg/dL — ABNORMAL HIGH (ref 0.44–1.00)
GFR calc Af Amer: 19 mL/min — ABNORMAL LOW (ref >60)
GFR calc non Af Amer: 16 mL/min — ABNORMAL LOW (ref >60)
Glucose, Bld: 127 mg/dL — ABNORMAL HIGH (ref 65–99)
Potassium: 3.8 mmol/L (ref 3.5–5.1)
Sodium: 140 mmol/L (ref 135–145)
Total Bilirubin: 0.6 mg/dL (ref 0.3–1.2)
Total Protein: 7.4 g/dL (ref 6.5–8.1)

## 2015-08-27 LAB — CBC WITH DIFFERENTIAL/PLATELET
Basophils Absolute: 0 10*3/uL (ref 0.0–0.1)
Basophils Relative: 1 %
Eosinophils Absolute: 0.2 10*3/uL (ref 0.0–0.7)
Eosinophils Relative: 3 %
HCT: 29.4 % — ABNORMAL LOW (ref 36.0–46.0)
Hemoglobin: 9.5 g/dL — ABNORMAL LOW (ref 12.0–15.0)
Lymphocytes Relative: 8 %
Lymphs Abs: 0.5 10*3/uL — ABNORMAL LOW (ref 0.7–4.0)
MCH: 28.4 pg (ref 26.0–34.0)
MCHC: 32.3 g/dL (ref 30.0–36.0)
MCV: 88 fL (ref 78.0–100.0)
Monocytes Absolute: 1 10*3/uL (ref 0.1–1.0)
Monocytes Relative: 15 %
Neutro Abs: 4.9 10*3/uL (ref 1.7–7.7)
Neutrophils Relative %: 75 %
Platelets: 355 10*3/uL (ref 150–400)
RBC: 3.34 MIL/uL — ABNORMAL LOW (ref 3.87–5.11)
RDW: 13.6 % (ref 11.5–15.5)
WBC: 6.6 10*3/uL (ref 4.0–10.5)

## 2015-08-27 LAB — LIPASE, BLOOD: Lipase: 43 U/L (ref 11–51)

## 2015-08-27 MED ORDER — ONDANSETRON HCL 4 MG PO TABS: 4.0000 mg | ORAL_TABLET | Freq: Four times a day (QID) | ORAL | Status: DC

## 2015-08-27 NOTE — ED Notes (Signed)
Pt complains of worsening weakness in her legs and fatigue over the past 2-3 weeks. Pt states she has been unable to eat or drink as much due to vomiting and fullness in her throat for 2-3 weeks. Pt had endoscopy performed on Thursday showing an ulcerated mass.

## 2015-08-27 NOTE — ED Provider Notes (Signed)
CSN: [id number]     Arrival date & time 08/27/15  1124 History   First MD Initiated Contact with Patient 08/27/15 1130     Chief Complaint  Patient presents with  . Fatigue  . Emesis    HPI   80 YOF presents today for weakness. Pt states that over the last several weeks she has progressive generalized weakness. She is being followed by [facility] for worsening reflux symptoms. She was most recently seen in the office on 08/24/15 and had an endoscopy performed where a mass was noted. Pt reports that she called the office yesterday but no results were available. She reports the weakness has continued to progress, and reports that she is able to ambulate, is tolerating by mouth. She reports that if she eats too much food she does have emesis, but is able to manage this with small amounts of food. She denies any chest pain, shortness of breath, abdominal pain, changes in bowel or bladder characteristics or frequency. Patient denies any significant weight loss, fevers.    Past Medical History  Diagnosis Date  . Adenomatous colon polyp 02/2001  . Diverticulosis   . Internal hemorrhoids   . Polycystic kidney disease     Stage 4 per patient  . GERD (gastroesophageal reflux disease)   . Hypertension   . Lichen planus    Past Surgical History  Procedure Laterality Date  . Oophorectomy    . Appendectomy    . Cholecystectomy    . Partial hysterectomy     Family History  Problem Relation Age of Onset  . Breast cancer Sister   . Heart disease Mother   . Kidney disease Mother   . Colon cancer Neg Hx   . Kidney disease Father   . Heart disease Father    Social History  Substance Use Topics  . Smoking status: Never Smoker   . Smokeless tobacco: Never Used  . Alcohol Use: Yes     Comment: occ.   OB History    No data available     Review of Systems  All other systems reviewed and are negative.   Allergies  Review of patient's allergies indicates no known  allergies.  Home Medications   Prior to Admission medications   Medication Sig Start Date End Date Taking? Authorizing Provider  alendronate (FOSAMAX) 70 MG tablet Take 70 mg by mouth once a week. Take with a full glass of water on an empty stomach.   Yes Historical Provider, MD  amLODipine (NORVASC) 5 MG tablet Take 5 mg by mouth daily.   Yes Historical Provider, MD  Cholecalciferol (VITAMIN D3) 2000 UNITS TABS Take 1 tablet by mouth daily.   Yes Historical Provider, MD  loratadine (CLARITIN) 10 MG tablet Take 10 mg by mouth daily.   Yes Historical Provider, MD  omeprazole (PRILOSEC) 40 MG capsule Take 40 mg by mouth daily.  08/05/15  Yes Historical Provider, MD  bisacodyl (DULCOLAX) 5 MG EC tablet Take 5 mg by mouth as needed for moderate constipation.    Historical Provider, MD  Na Sulfate-K Sulfate-Mg Sulf SOLN Take 1 kit by mouth once. Patient not taking: Reported on 08/27/2015 08/24/15   Ladene Artist, MD  ondansetron (ZOFRAN) 4 MG tablet Take 1 tablet (4 mg total) by mouth every 8 (eight) hours as needed for nausea or vomiting. 08/26/15   Jerene Bears, MD  ondansetron (ZOFRAN) 4 MG tablet Take 1 tablet (4 mg total) by mouth every 6 (six) hours.  08/27/15   Eyvonne Mechanic, PA-C  polyethylene glycol (MIRALAX / GLYCOLAX) packet Take 17 g by mouth daily as needed for mild constipation or moderate constipation.     Historical Provider, MD   BP 125/67 mmHg  Pulse 85  Temp(Src) 98.1 F (36.7 C) (Oral)  Resp 20  Ht 5' 3.5" (1.613 m)  Wt 67.132 kg  BMI 25.80 kg/m2  SpO2 100%   Physical Exam  Constitutional: She is oriented to person, place, and time. She appears well-developed and well-nourished.  HENT:  Head: Normocephalic and atraumatic.  Eyes: Conjunctivae are normal. Pupils are equal, round, and reactive to light. Right eye exhibits no discharge. Left eye exhibits no discharge. No scleral icterus.  Neck: Normal range of motion. No JVD present. No tracheal deviation present.   Cardiovascular: Normal rate, regular rhythm, normal heart sounds and intact distal pulses.  Exam reveals no gallop and no friction rub.   No murmur heard. Pulmonary/Chest: Effort normal and breath sounds normal. No stridor. No respiratory distress. She has no wheezes. She has no rales. She exhibits no tenderness.  Abdominal: She exhibits no distension and no mass. There is no tenderness. There is no rebound and no guarding.  Musculoskeletal: She exhibits no edema or tenderness.  Neurological: She is alert and oriented to person, place, and time. Coordination normal.  Skin: Skin is warm and dry. No rash noted. No erythema.  Psychiatric: She has a normal mood and affect. Her behavior is normal. Judgment and thought content normal.  Nursing note and vitals reviewed.   ED Course  Procedures (including critical care time) Labs Review Labs Reviewed  CBC WITH DIFFERENTIAL/PLATELET - Abnormal; Notable for the following:    RBC 3.34 (*)    Hemoglobin 9.5 (*)    HCT 29.4 (*)    Lymphs Abs 0.5 (*)    All other components within normal limits  COMPREHENSIVE METABOLIC PANEL - Abnormal; Notable for the following:    CO2 18 (*)    Glucose, Bld 127 (*)    BUN 29 (*)    Creatinine, Ser 2.63 (*)    Calcium 8.7 (*)    Albumin 3.4 (*)    AST 65 (*)    Alkaline Phosphatase 255 (*)    GFR calc non Af Amer 16 (*)    GFR calc Af Amer 19 (*)    All other components within normal limits  LIPASE, BLOOD    Imaging Review Ct Abdomen Pelvis Wo Contrast  08/26/2015  CLINICAL DATA:  Duodenal mass or recently diagnosed by endoscopy. 10 pound weight loss and nausea over past 2 months. Polycystic kidney disease. EXAM: CT ABDOMEN AND PELVIS WITHOUT CONTRAST TECHNIQUE: Multidetector CT imaging of the abdomen and pelvis was performed following the standard protocol without IV contrast. COMPARISON:  11/14/2006 FINDINGS: Lower chest: New sub-cm pulmonary nodules are seen in both lung bases, highly suspicious for  pulmonary metastases. Hepatobiliary: Several previously seen hepatic cysts are again noted, however there are multiple ill-defined soft tissue acute attenuation masses seen throughout the liver which are new since previous study and consistent with diffuse liver metastases. Pancreas: A soft tissue mass is seen involving the transverse duodenum which is contiguous with the pancreatic uncinate process. This measures 3.0 x 5.8 cm on image 37/series 2. The remainder the pancreas is unremarkable in appearance on this unenhanced exam. Spleen: Within normal limits in size. Adrenals/Urinary Tract: Adrenal glands are unremarkable. Both kidneys are enlarged with numerous cystic lesions again seen, consistent polycystic kidney disease. Several  small left renal calculi are again seen, however there is no evidence of ureteral calculi or hydronephrosis. Stomach/Bowel: Mass involving the transverse duodenum measures 3.0 x 5.8 cm on image 37/series 2. This mass is contiguous with the pancreatic uncinate process. No evidence of bowel obstruction. Colonic diverticulosis noted, without evidence of diverticulitis. Vascular/Lymphatic: Mild lymphadenopathy seen within the central small bowel mesenteric, with largest index lymph node measuring 1.4 cm on image 33/series 2. Mild lymphadenopathy also seen within the porta hepatis, with the index portacaval lymph node measuring 11 mm on image 26/series 2. Reproductive: Prior hysterectomy noted. Adnexal regions are unremarkable in appearance. Tiny amount of free fluid noted in pelvis. Other: None. Musculoskeletal:  No suspicious bone lesions identified. IMPRESSION: 5.8 cm soft tissue mass involving the transverse duodenum which is also contiguous with the pancreatic uncinate process. Differential diagnosis includes primary duodenal carcinoma and pancreatic carcinoma. Metastatic lymphadenopathy within the central small bowel mesentery and porta hepatis. Diffuse liver metastases. New sub-cm  bibasilar pulmonary nodules, highly suspicious for pulmonary metastases. Polycystic kidney disease and nonobstructive renal calculi again noted. Electronically Signed   By: Earle Gell M.D.   On: 08/26/2015 09:59   I have personally reviewed and evaluated these images and lab results as part of my medical decision-making.   EKG Interpretation None      MDM   Final diagnoses:  Adenocarcinoma of duodenum (HCC)    Labs: CBC, CMP, lipase- creatinine of 2.63 from 2.572 days ago. Patient history of polycystic kidney disease, alkaline phosphatase 255  Imaging:  Consults: Oncology Ennever MD  Therapeutics:  Discharge Meds:   Assessment/Plan: 80 year old female presents today with fatigue. Patient had recent biopsy showing adenocarcinoma, but a CT scan showing likely pancreatic cancer with metastases to liver and possibly the lungs. Patient's labs here showed no significant findings that would indicate hospital management. I spoke with Dr. Marin Olp of oncology reports he will have his facility contact her on Monday to schedule evaluation to discuss further treatment options. Patient is very well-appearing 80 year old female in no acute distress, she is afebrile, nontoxic. She has no complaints of pain. She will be instructed to rest, drink plenty of fluids, and follow-up with oncology and her gastroenterologist on Monday. She is given strict return precautions, verbalized understanding and agreement to today's plan.         Okey Regal, PA-C 08/27/15 [place], MD 08/31/15 [phone]

## 2015-08-29 ENCOUNTER — Telehealth: Payer: Self-pay | Admitting: *Deleted

## 2015-08-29 ENCOUNTER — Telehealth: Payer: Self-pay | Admitting: Gastroenterology

## 2015-08-29 MED ORDER — TRAMADOL HCL 50 MG PO TABS: 50.0000 mg | ORAL_TABLET | Freq: Three times a day (TID) | ORAL | Status: AC | PRN

## 2015-08-29 NOTE — Telephone Encounter (Signed)
Patient notified rx sent Her son is aware. She asked if she could use Ensure.  She is advised that she can eat or drink anything that she can tolerate.

## 2015-08-29 NOTE — Telephone Encounter (Signed)
Patient is requesting something for pain.  She sees Dr. Benay Spice on Wed.  Please advise

## 2015-08-29 NOTE — Telephone Encounter (Signed)
Oncology Nurse Navigator Documentation  Oncology Nurse Navigator Flowsheets 08/29/2015  Navigator Location [facility]  Navigator Encounter Type Introductory phone call   Spoke with son and provided new patient appointment for 08/31/15 at 11:15/11:30 with Dr. Benay Spice. Informed of location of [place], valet service, and registration process. Reminded to bring insurance cards and a current medication list, including supplements. He verbalizes understanding.

## 2015-08-29 NOTE — Telephone Encounter (Signed)
VM message received from pt's son seeking to confirm his mother's appt for this week.  TC back to son and confirmed that his mother does have an appt with Dr. Benay Spice on 08/31/15 @ 11:15/11:30 am. See Merceda Elks, RN note from today - 08/29/15

## 2015-08-29 NOTE — Telephone Encounter (Signed)
Tramadol 50 mg po tid, #20, no refills

## 2015-08-31 ENCOUNTER — Encounter: Payer: Self-pay | Admitting: *Deleted

## 2015-08-31 ENCOUNTER — Telehealth: Payer: Self-pay | Admitting: Oncology

## 2015-08-31 ENCOUNTER — Encounter: Payer: Self-pay | Admitting: Oncology

## 2015-08-31 ENCOUNTER — Other Ambulatory Visit: Payer: Self-pay | Admitting: General Surgery

## 2015-08-31 ENCOUNTER — Telehealth: Payer: Self-pay | Admitting: *Deleted

## 2015-08-31 ENCOUNTER — Ambulatory Visit (HOSPITAL_BASED_OUTPATIENT_CLINIC_OR_DEPARTMENT_OTHER): Payer: Medicare Other | Admitting: Oncology

## 2015-08-31 VITALS — BP 115/66 | HR 103 | Temp 97.7°F | Resp 18 | Ht 63.5 in | Wt 144.9 lb

## 2015-08-31 DIAGNOSIS — C787 Secondary malignant neoplasm of liver and intrahepatic bile duct: Secondary | ICD-10-CM

## 2015-08-31 DIAGNOSIS — C784 Secondary malignant neoplasm of small intestine: Secondary | ICD-10-CM

## 2015-08-31 DIAGNOSIS — C7800 Secondary malignant neoplasm of unspecified lung: Secondary | ICD-10-CM | POA: Diagnosis not present

## 2015-08-31 DIAGNOSIS — C257 Malignant neoplasm of other parts of pancreas: Secondary | ICD-10-CM

## 2015-08-31 DIAGNOSIS — C251 Malignant neoplasm of body of pancreas: Secondary | ICD-10-CM

## 2015-08-31 NOTE — Progress Notes (Signed)
[facility] New Patient Consult   Referring MD: Hulan Fess, [place], [place] [address]   Erica Simpson 80 y.o.  [birth year]    Reason for Referral: Pancreas mass, duodenal mass   HPI: Erica Simpson was diagnosed with iron deficiency by her nephrologist. She also reports a one-month history of progressive malaise and anorexia. She has noted increased reflux symptoms. She vomits after a large meal. She was referred to Erica Simpson and was scheduled for an upper endoscopy and colonoscopy 08/25/2015. The upper endoscopy was remarkable for a 3-4 cm near circumferential mass in the third part of the duodenum. The mass was biopsied. No abnormality in the duodenal bulb and second part of the duodenum. A benign appearing stricture was noted at the GE junction. She could not tolerate the bowel prep and the colonoscopy was not performed. The pathology from the duodenal biopsy ([id number]) confirmed invasive adenocarcinoma. She was referred for CTs of the abdomen and pelvis on 08/26/2015. Subcentimeter pulmonary nodules were noted bilaterally suspicious for pulmonary metastases. Multiple ill-defined lesions in the liver are new compared to a CT from 2008 and consistent with diffuse liver metastases. A soft tissue mass was noted in the transverse duodenum, contiguous with a pancreatic uncinate 8 mass. Both kidneys are enlarged with numerous cystic lesions consistent with polycystic kidney disease. Lymphadenopathy was seen at the central small bowel mesentery and porta hepatis.  She is tolerating a liquid diet.  Past Medical History  Diagnosis Date  . Adenomatous colon polyp 02/2001  . Diverticulosis   . Internal hemorrhoids   . Polycystic kidney disease       . GERD (gastroesophageal reflux disease)   . Hypertension   . Lichen planus     .  G1 P1  Past Surgical History  Procedure Laterality Date  . Oophorectomy    . Appendectomy    . Cholecystectomy    .  Partial hysterectomy      Medications: Reviewed  Allergies: No Known Allergies  Family history: A maternal aunt had cervical cancer. Her sister had breast cancer. No other family history of cancer.  Social History:   She lives alone in [place]. She is retired from a Veterinary surgeon job. She does not smoke. No risk factor for HIV or hepatitis.  History  Alcohol Use  . Yes    Comment: occ.    History  Smoking status  . Never Smoker   Smokeless tobacco  . Never Used      ROS:   Positives include: Fatigue, anorexia, temp loss, emesis following large meals, constipation, dark urine, dyspnea on exertion, right subcostal discomfort  A complete ROS was otherwise negative.  Physical Exam:  Blood pressure 115/66, pulse 103, temperature 97.7 F (36.5 C), temperature source Oral, resp. rate 18, height 5' 3.5" (1.613 m), weight 144 lb 14.4 oz (65.726 kg), SpO2 100 %.  HEENT: Oropharynx without visible mass, neck without mass Lungs: Clear bilaterally Cardiac: Regular rate and rhythm Abdomen: No hepatosplenomegaly, no mass, nontender  Vascular: No leg edema Lymph nodes: No cervical, supraclavicular, axillary, or inguinal nodes Neurologic: Alert and oriented, the motor exam appears intact in the upper and lower extremities Skin: No rash Musculoskeletal: No spine tenderness   LAB:  CBC  Lab Results  Component Value Date   WBC 6.6 08/27/2015   HGB 9.5* 08/27/2015   HCT 29.4* 08/27/2015   MCV 88.0 08/27/2015   PLT 355 08/27/2015   NEUTROABS 4.9 08/27/2015  CMP      Component Value Date/Time   NA 140 08/27/2015 1200   K 3.8 08/27/2015 1200   CL 110 08/27/2015 1200   CO2 18* 08/27/2015 1200   GLUCOSE 127* 08/27/2015 1200   BUN 29* 08/27/2015 1200   CREATININE 2.63* 08/27/2015 1200   CALCIUM 8.7* 08/27/2015 1200   PROT 7.4 08/27/2015 1200   ALBUMIN 3.4* 08/27/2015 1200   AST 65* 08/27/2015 1200   ALT 49 08/27/2015 1200   ALKPHOS 255* 08/27/2015 1200    BILITOT 0.6 08/27/2015 1200   GFRNONAA 16* 08/27/2015 1200   GFRAA 19* 08/27/2015 1200    Imaging: CT abdomen 08/26/2015 reviewed with Erica Simpson  and her family   Assessment/Simpson:   1. Adenocarcinoma involving a duodenal mass  CT 08/27/2015 consistent with a duodenal mass/uncinate mass, liver metastases, pulmonary metastases, and metastatic lymphadenopathy  2. Anorexia/weight loss  3.   Intermittent vomiting secondary to the duodenal mass  4.   Polycystic kidney disease/renal failure   Disposition:   Erica Simpson has been diagnosed with adenocarcinoma involving a duodenal mass. There appears to be a pancreas uncinate mass contiguous with the duodenal process on the staging CT. She appears to have metastatic disease involving the lungs and liver.  I discussed the differential diagnosis with Erica Simpson and her family. The clinical presentation is consistent with both pancreas cancer and duodenal cancer. Pancreas cancer is a more common diagnosis and the extent of tumor spread is consistent with this diagnosis. We will check a CEA and CA 19-9 level.  I discussed treatment options with Erica Simpson and her family. No therapy will be curative. We discussed supportive care versus a trial of systemic therapy. We specifically discussed gemcitabine/Abraxane therapy if the tumor is felt to represent pancreas cancer.  I reviewed the potential toxicities associated with this regimen including the chance for nausea/vomiting, mucositis, diarrhea, alopecia, and hematologic toxicity. We discussed the fever, rash, and pneumonitis associated with gemcitabine. We discussed the neuropathy seen with Abraxane. She agrees to proceed with a trial of gemcitabine/Abraxane chemotherapy.  Erica Simpson will be scheduled for placement of a Port-A-Cath and a chemotherapy teaching class. Her case will be presented at the GI tumor conference 09/07/2015. I will review her CT in radiology and at the conference. We will  follow-up on the CA 19-9. We will consider switching to FOLFOX or FOLFIRI chemotherapy if the tumor is felt to more likely be a small bowel primary.  Approximately 50 minutes were spent with the patient today. The majority of the time was used for counseling and coordination of care.  Betsy Coder, MD  08/31/2015, 1:14 PM

## 2015-08-31 NOTE — Telephone Encounter (Signed)
Pt confirmed labs/ov per 02/08 POF, gave pt AVS and Calendar.... KJ, sent msg to add chemo, s/w BNeff confirming she will try to see pt in chemo room on 02/15 once chemo is added to schedule... KJ

## 2015-08-31 NOTE — Progress Notes (Signed)
Oncology Nurse Navigator Documentation  Oncology Nurse Navigator Flowsheets 08/31/2015  Navigator Location [facility]  Navigator Encounter Type Initial MedOnc  Abnormal Finding Date 08/25/2015  Confirmed Diagnosis Date 08/25/2015  Patient Visit Type MedOnc;Initial  Treatment Phase Pre-Tx/Tx Discussion  Barriers/Navigation Needs Education  Education Understanding Cancer/ Treatment Options;Coping with Diagnosis/ Prognosis;Newly Diagnosed Cancer Education;Preparing for Upcoming Surgery/ Treatment  Interventions Education Method;Referrals  Referrals Nutrition/dietician  Education Method Verbal;Written  Support Groups/Services GI Support Group;[place] Manager  Acuity Level 2  Time Spent with Patient 94  Met with patient, son, and sister during new patient visit. Explained the role of the GI Nurse Navigator and provided New Patient Packet with information on: 1. Pancreas cancer 2. Support groups 3. Advanced Directives 4. Fall Safety Plan Answered questions, reviewed current treatment plan using TEACH back and provided emotional support. Provided copy of current treatment plan. Reviewed what CA 19-9 is and significance, side effects of chemotherapy and instructed son in how to get FMLA forms to office. Dr. Benay Spice will present case at GI Tumor Board on 09/07/15. Patient understands that the chemotherapy is palliative and not curative. Encourage her to get more active-walk more and push protein and calories.  Merceda Elks, RN, BSN GI Oncology [place]

## 2015-08-31 NOTE — Telephone Encounter (Signed)
Per staff message and POF I have scheduled appts. Advised scheduler of appts. JMW  

## 2015-09-01 ENCOUNTER — Ambulatory Visit (HOSPITAL_COMMUNITY)
Admission: RE | Admit: 2015-09-01 | Discharge: 2015-09-01 | Disposition: A | Discharge status: Home / Self Care | Payer: Medicare Other | Source: Ambulatory Visit | Attending: Oncology | Admitting: Oncology

## 2015-09-01 ENCOUNTER — Encounter (HOSPITAL_COMMUNITY): Payer: Self-pay

## 2015-09-01 ENCOUNTER — Other Ambulatory Visit: Payer: Self-pay | Admitting: Oncology

## 2015-09-01 DIAGNOSIS — C257 Malignant neoplasm of other parts of pancreas: Secondary | ICD-10-CM

## 2015-09-01 DIAGNOSIS — C170 Malignant neoplasm of duodenum: Secondary | ICD-10-CM | POA: Diagnosis not present

## 2015-09-01 LAB — CBC WITH DIFFERENTIAL/PLATELET
BASOS ABS: 0 10*3/uL (ref 0.0–0.1)
Basophils Relative: 0 %
Eosinophils Absolute: 0.2 10*3/uL (ref 0.0–0.7)
Eosinophils Relative: 4 %
HEMATOCRIT: 26.8 % — AB (ref 36.0–46.0)
Hemoglobin: 8.4 g/dL — ABNORMAL LOW (ref 12.0–15.0)
LYMPHS PCT: 9 %
Lymphs Abs: 0.4 10*3/uL — ABNORMAL LOW (ref 0.7–4.0)
MCH: 27.4 pg (ref 26.0–34.0)
MCHC: 31.3 g/dL (ref 30.0–36.0)
MCV: 87.3 fL (ref 78.0–100.0)
Monocytes Absolute: 0.7 10*3/uL (ref 0.1–1.0)
Monocytes Relative: 15 %
NEUTROS ABS: 3.2 10*3/uL (ref 1.7–7.7)
Neutrophils Relative %: 72 %
PLATELETS: 240 10*3/uL (ref 150–400)
RBC: 3.07 MIL/uL — AB (ref 3.87–5.11)
RDW: 13.7 % (ref 11.5–15.5)
WBC: 4.5 10*3/uL (ref 4.0–10.5)

## 2015-09-01 LAB — COMPREHENSIVE METABOLIC PANEL
ALBUMIN: 2.9 g/dL — AB (ref 3.5–5.0)
ALT: 55 U/L — AB (ref 14–54)
AST: 61 U/L — AB (ref 15–41)
Alkaline Phosphatase: 239 U/L — ABNORMAL HIGH (ref 38–126)
Anion gap: 12 (ref 5–15)
BUN: 36 mg/dL — ABNORMAL HIGH (ref 6–20)
CHLORIDE: 111 mmol/L (ref 101–111)
CO2: 19 mmol/L — ABNORMAL LOW (ref 22–32)
Calcium: 9.1 mg/dL (ref 8.9–10.3)
Creatinine, Ser: 2.68 mg/dL — ABNORMAL HIGH (ref 0.44–1.00)
GFR calc Af Amer: 18 mL/min — ABNORMAL LOW (ref >60)
GFR, EST NON AFRICAN AMERICAN: 16 mL/min — AB (ref >60)
Glucose, Bld: 111 mg/dL — ABNORMAL HIGH (ref 65–99)
Potassium: 4.4 mmol/L (ref 3.5–5.1)
Sodium: 142 mmol/L (ref 135–145)
Total Bilirubin: 0.6 mg/dL (ref 0.3–1.2)
Total Protein: 6.4 g/dL — ABNORMAL LOW (ref 6.5–8.1)

## 2015-09-01 LAB — PROTIME-INR
INR: 1.19 (ref 0.00–1.49)
Prothrombin Time: 14.9 seconds (ref 11.6–15.2)

## 2015-09-01 LAB — APTT: APTT: 26 s (ref 24–37)

## 2015-09-01 MED ORDER — LIDOCAINE-EPINEPHRINE 2 %-1:100000 IJ SOLN
INTRAMUSCULAR | Status: AC
  Filled 2015-09-01: qty 1

## 2015-09-01 MED ORDER — HEPARIN SOD (PORK) LOCK FLUSH 100 UNIT/ML IV SOLN
INTRAVENOUS | Status: AC
  Filled 2015-09-01: qty 5

## 2015-09-01 MED ORDER — HEPARIN SOD (PORK) LOCK FLUSH 100 UNIT/ML IV SOLN
INTRAVENOUS | Status: AC | PRN
  Administered 2015-09-01: 500 [IU]

## 2015-09-01 MED ORDER — MIDAZOLAM HCL 2 MG/2ML IJ SOLN
INTRAMUSCULAR | Status: AC
  Filled 2015-09-01: qty 4

## 2015-09-01 MED ORDER — MIDAZOLAM HCL 2 MG/2ML IJ SOLN
INTRAMUSCULAR | Status: AC | PRN
  Administered 2015-09-01: 1 mg via INTRAVENOUS

## 2015-09-01 MED ORDER — CEFAZOLIN SODIUM-DEXTROSE 2-3 GM-% IV SOLR
2.0000 g | INTRAVENOUS | Status: AC
  Administered 2015-09-01: 2 g via INTRAVENOUS

## 2015-09-01 MED ORDER — SODIUM CHLORIDE 0.9 % IV SOLN
INTRAVENOUS | Status: DC
  Administered 2015-09-01: 10:00:00 via INTRAVENOUS

## 2015-09-01 MED ORDER — FENTANYL CITRATE (PF) 100 MCG/2ML IJ SOLN
INTRAMUSCULAR | Status: AC | PRN
  Administered 2015-09-01: 50 ug via INTRAVENOUS

## 2015-09-01 MED ORDER — FENTANYL CITRATE (PF) 100 MCG/2ML IJ SOLN
INTRAMUSCULAR | Status: AC
  Filled 2015-09-01: qty 2

## 2015-09-01 MED ORDER — CEFAZOLIN SODIUM-DEXTROSE 2-3 GM-% IV SOLR
INTRAVENOUS | Status: AC
  Filled 2015-09-01: qty 50

## 2015-09-01 NOTE — Sedation Documentation (Signed)
Patient denies pain and is resting comfortably.  

## 2015-09-01 NOTE — Procedures (Signed)
R IJ Port cathter placement with US and fluoroscopy No complication No blood loss. See complete dictation in [facility] PACS.  

## 2015-09-01 NOTE — Discharge Instructions (Signed)
Implanted Port Insertion, Care After °Refer to this sheet in the next few weeks. These instructions provide you with information on caring for yourself after your procedure. Your health care provider may also give you more specific instructions. Your treatment has been planned according to current medical practices, but problems sometimes occur. Call your health care provider if you have any problems or questions after your procedure. °WHAT TO EXPECT AFTER THE PROCEDURE °After your procedure, it is typical to have the following:  °· Discomfort at the port insertion site. Ice packs to the area will help. °· Bruising on the skin over the port. This will subside in 3-4 days. °HOME CARE INSTRUCTIONS °· After your port is placed, you will get a manufacturer's information card. The card has information about your port. Keep this card with you at all times.   °· Know what kind of port you have. There are many types of ports available.   °· Wear a medical alert bracelet in case of an emergency. This can help alert health care workers that you have a port.   °· The port can stay in for as long as your health care provider believes it is necessary.   °· A home health care nurse may give medicines and take care of the port.   °· You or a family member can get special training and directions for giving medicine and taking care of the port at home.   °SEEK MEDICAL CARE IF:  °· Your port does not flush or you are unable to get a blood return.   °· You have a fever or chills. °SEEK IMMEDIATE MEDICAL CARE IF: °· You have new fluid or pus coming from your incision.   °· You notice a bad smell coming from your incision site.   °· You have swelling, pain, or more redness at the incision or port site.   °· You have chest pain or shortness of breath. °  °This information is not intended to replace advice given to you by your health care provider. Make sure you discuss any questions you have with your health care provider. °  °Document  Released: 04/29/2013 Document Revised: 07/14/2013 Document Reviewed: 04/29/2013 °Elsevier Interactive Patient Education ©2016 [employer] °Implanted Port Home Guide °An implanted port is a type of central line that is placed under the skin. Central lines are used to provide IV access when treatment or nutrition needs to be given through a person's veins. Implanted ports are used for long-term IV access. An implanted port may be placed because:  °· You need IV medicine that would be irritating to the small veins in your hands or arms.   °· You need long-term IV medicines, such as antibiotics.   °· You need IV nutrition for a long period.   °· You need frequent blood draws for lab tests.   °· You need dialysis.   °Implanted ports are usually placed in the chest area, but they can also be placed in the upper arm, the abdomen, or the leg. An implanted port has two main parts:  °· Reservoir. The reservoir is round and will appear as a small, raised area under your skin. The reservoir is the part where a needle is inserted to give medicines or draw blood.   °· Catheter. The catheter is a thin, flexible tube that extends from the reservoir. The catheter is placed into a large vein. Medicine that is inserted into the reservoir goes into the catheter and then into the vein.   °HOW WILL I CARE FOR MY INCISION SITE? °Do not get the   incision site wet. Bathe or shower as directed by your health care provider.  °HOW IS MY PORT ACCESSED? °Special steps must be taken to access the port:  °· Before the port is accessed, a numbing cream can be placed on the skin. This helps numb the skin over the port site.   °· Your health care provider uses a sterile technique to access the port. °· Your health care provider must put on a mask and sterile gloves. °· The skin over your port is cleaned carefully with an antiseptic and allowed to dry. °· The port is gently pinched between sterile gloves, and a needle is inserted into the  port. °· Only "non-coring" port needles should be used to access the port. Once the port is accessed, a blood return should be checked. This helps ensure that the port is in the vein and is not clogged.   °· If your port needs to remain accessed for a constant infusion, a clear (transparent) bandage will be placed over the needle site. The bandage and needle will need to be changed every week, or as directed by your health care provider.   °· Keep the bandage covering the needle clean and dry. Do not get it wet. Follow your health care provider's instructions on how to take a shower or bath while the port is accessed.   °· If your port does not need to stay accessed, no bandage is needed over the port.   °WHAT IS FLUSHING? °Flushing helps keep the port from getting clogged. Follow your health care provider's instructions on how and when to flush the port. Ports are usually flushed with saline solution or a medicine called heparin. The need for flushing will depend on how the port is used.  °· If the port is used for intermittent medicines or blood draws, the port will need to be flushed:   °· After medicines have been given.   °· After blood has been drawn.   °· As part of routine maintenance.   °· If a constant infusion is running, the port may not need to be flushed.   °HOW LONG WILL MY PORT STAY IMPLANTED? °The port can stay in for as long as your health care provider thinks it is needed. When it is time for the port to come out, surgery will be done to remove it. The procedure is similar to the one performed when the port was put in.  °WHEN SHOULD I SEEK IMMEDIATE MEDICAL CARE? °When you have an implanted port, you should seek immediate medical care if:  °· You notice a bad smell coming from the incision site.   °· You have swelling, redness, or drainage at the incision site.   °· You have more swelling or pain at the port site or the surrounding area.   °· You have a fever that is not controlled with  medicine. °  °This information is not intended to replace advice given to you by your health care provider. Make sure you discuss any questions you have with your health care provider. °  °Document Released: 07/09/2005 Document Revised: 04/29/2013 Document Reviewed: 03/16/2013 °Elsevier Interactive Patient Education ©2016 [employer] ° ° °Moderate Conscious Sedation, Adult, Care After °Refer to this sheet in the next few weeks. These instructions provide you with information on caring for yourself after your procedure. Your health care provider may also give you more specific instructions. Your treatment has been planned according to current medical practices, but problems sometimes occur. Call your health care provider if you have any problems or questions   after your procedure. °WHAT TO EXPECT AFTER THE PROCEDURE  °After your procedure: °· You may feel sleepy, clumsy, and have poor balance for several hours. °· Vomiting may occur if you eat too soon after the procedure. °HOME CARE INSTRUCTIONS °· Do not participate in any activities where you could become injured for at least 24 hours. Do not: °¨ Drive. °¨ Swim. °¨ Ride a bicycle. °¨ Operate heavy machinery. °¨ Cook. °¨ Use power tools. °¨ Climb ladders. °¨ Work from a high place. °· Do not make important decisions or sign legal documents until you are improved. °· If you vomit, drink water, juice, or soup when you can drink without vomiting. Make sure you have little or no nausea before eating solid foods. °· Only take over-the-counter or prescription medicines for pain, discomfort, or fever as directed by your health care provider. °· Make sure you and your family fully understand everything about the medicines given to you, including what side effects may occur. °· You should not drink alcohol, take sleeping pills, or take medicines that cause drowsiness for at least 24 hours. °· If you smoke, do not smoke without supervision. °· If you are feeling better, you  may resume normal activities 24 hours after you were sedated. °· Keep all appointments with your health care provider. °SEEK MEDICAL CARE IF: °· Your skin is pale or bluish in color. °· You continue to feel nauseous or vomit. °· Your pain is getting worse and is not helped by medicine. °· You have bleeding or swelling. °· You are still sleepy or feeling clumsy after 24 hours. °SEEK IMMEDIATE MEDICAL CARE IF: °· You develop a rash. °· You have difficulty breathing. °· You develop any type of allergic problem. °· You have a fever. °MAKE SURE YOU: °· Understand these instructions. °· Will watch your condition. °· Will get help right away if you are not doing well or get worse. °  °This information is not intended to replace advice given to you by your health care provider. Make sure you discuss any questions you have with your health care provider. °  °Document Released: 04/29/2013 Document Revised: 07/30/2014 Document Reviewed: 04/29/2013 °Elsevier Interactive Patient Education ©2016 [employer]vier Inc. ° °

## 2015-09-01 NOTE — H&P (Signed)
Chief Complaint: duodenal vs pancreatic adenoca Referring Physician: Dr. Benay Spice HPI: Erica Simpson is an 80 y.o. female who was presented to Dr. Fuller Plan on 08-25-15 with worsening reflux symptoms.  He proceeded with an endoscopy due to a history of anemia and polyps.  She was noted to have a partially obstructing duodenal mass.  She then had a CT scan that revealed a 5.8cm mass involving the duodenum and the pancreas.  She was also noted to have liver mets, possible lung mets, and regional LAD.  She was referred to Dr. Benay Spice and ultimately referred here today for placement of a port a cath to initiate chemotherapy.    Past Medical History:  Past Medical History  Diagnosis Date  . Adenomatous colon polyp 02/2001  . Diverticulosis   . Internal hemorrhoids   . Polycystic kidney disease     Stage 4 per patient  . GERD (gastroesophageal reflux disease)   . Hypertension   . Lichen planus     Past Surgical History:  Past Surgical History  Procedure Laterality Date  . Oophorectomy    . Appendectomy    . Cholecystectomy    . Partial hysterectomy      Family History:  Family History  Problem Relation Age of Onset  . Breast cancer Sister   . Heart disease Mother   . Kidney disease Mother   . Colon cancer Neg Hx   . Kidney disease Father   . Heart disease Father     Social History:  reports that she has never smoked. She has never used smokeless tobacco. She reports that she drinks alcohol. She reports that she does not use illicit drugs.  Allergies: No Known Allergies  Medications:   Medication List    ASK your doctor about these medications        alendronate 70 MG tablet  Commonly known as:  FOSAMAX  Take 70 mg by mouth once a week. Take with a full glass of water on an empty stomach.     amLODipine 5 MG tablet  Commonly known as:  NORVASC  Take 5 mg by mouth daily.     bisacodyl 5 MG EC tablet  Commonly known as:  DULCOLAX  Take 5 mg by mouth as needed for  moderate constipation.     loratadine 10 MG tablet  Commonly known as:  CLARITIN  Take 10 mg by mouth daily.     omeprazole 40 MG capsule  Commonly known as:  PRILOSEC  Take 40 mg by mouth daily.     ondansetron 4 MG tablet  Commonly known as:  ZOFRAN  Take 1 tablet (4 mg total) by mouth every 6 (six) hours.     polyethylene glycol packet  Commonly known as:  MIRALAX / GLYCOLAX  Take 17 g by mouth daily as needed for mild constipation or moderate constipation.     traMADol 50 MG tablet  Commonly known as:  ULTRAM  Take 1 tablet (50 mg total) by mouth 3 (three) times daily as needed for moderate pain.     Vitamin D3 2000 units Tabs  Take 1 tablet by mouth daily.        Please HPI for pertinent positives, otherwise complete 10 system ROS negative, except for excessive weakness and feeling tired.  She denies abdominal pain, but some anorexia.  Mallampati Score: MD Evaluation Airway: WNL Heart: WNL Abdomen: WNL Chest/ Lungs: WNL ASA  Classification: 3 Mallampati/Airway Score: One  Physical Exam: There were  no vitals taken for this visit. There is no weight on file to calculate BMI.  General: pleasant, WD, WN white female who is laying in bed in NAD HEENT: head is normocephalic, atraumatic.  Sclera are noninjected.  PERRL.  Ears and nose without any masses or lesions.  Mouth is pink and dry Heart: regular, rate, and rhythm.  Normal s1,s2. No obvious murmurs, gallops, or rubs noted.  Palpable radial and pedal pulses bilaterally Lungs: CTAB, no wheezes, rhonchi, or rales noted.  Respiratory effort nonlabored Abd: soft, NT, ND, +BS, no masses, hernias, or organomegaly MS: all 4 extremities are symmetrical with no cyanosis, clubbing, or edema. Skin: warm and dry with no masses, lesions, or rashes Psych: A&Ox3 with an appropriate affect.   Labs: No results found for this or any previous visit (from the past 48 hour(s)).  Imaging: No results found.  Assessment/Plan 1.  Duodenal mass, adenocarcinoma -plan to place a [facility] today for initiation of chemotherapy with Dr. Benay Spice.   -CA19-9 and CEA will be ordered as well to help Dr. Benay Spice better delineate his line of treatment -Risks and Benefits discussed with the patient including, but not limited to bleeding, infection, pneumothorax, or fibrin sheath development and need for additional procedures. All of the patient's questions were answered, patient is agreeable to proceed. Consent signed and in chart.  Thank you for this interesting consult.  I greatly enjoyed meeting Erica Simpson and look forward to participating in their care.  A copy of this report was sent to the requesting provider on this date.  Electronically Signed: Henreitta Cea 09/01/2015, 8:59 AM   I spent a total of  20 minutes  in face to face in clinical consultation, greater than 50% of which was counseling/coordinating care for duodenal adenocarcinoma, need for [facility]

## 2015-09-02 ENCOUNTER — Other Ambulatory Visit: Payer: Medicare Other

## 2015-09-02 ENCOUNTER — Encounter: Payer: Medicare Other | Admitting: Nutrition

## 2015-09-02 ENCOUNTER — Encounter: Payer: Self-pay | Admitting: *Deleted

## 2015-09-02 LAB — CEA: CEA: 21.6 ng/mL — ABNORMAL HIGH (ref 0.0–4.7)

## 2015-09-02 LAB — CANCER ANTIGEN 19-9: CA 19 9: 14 U/mL (ref 0–35)

## 2015-09-02 NOTE — Progress Notes (Signed)
Spent time with patient and her family members in chemo class reviewing side effects of Abraxane and Gemzar.  See education notes

## 2015-09-02 NOTE — Telephone Encounter (Signed)
Call from son requesting script for EMLA cream to be sent to [facility]. Forwarded request to Dr. Gearldine Shown nurse to do this on Monday.

## 2015-09-03 ENCOUNTER — Other Ambulatory Visit: Payer: Self-pay | Admitting: Oncology

## 2015-09-05 ENCOUNTER — Telehealth: Payer: Self-pay | Admitting: *Deleted

## 2015-09-05 NOTE — Telephone Encounter (Signed)
  Oncology Nurse Navigator Documentation  Navigator Location: [facility] (09/05/15 0900) Navigator Encounter Type: Telephone (09/05/15 0900) Telephone: Incoming Call;Symptom Mgt (09/05/15 0900)  Patient called to inquire if her EMLA was called in yet. Informed her that son called Friday to request this and it will be done today (chemo is Thursday). She is asking for script to help her fall asleep at night. More trouble sleeping now. Took Benadry  25mg  at bedtime and took several hours to work. Has never had to take sleeping pill before.  Added that she is so fatigued, just walking around her home makes her tired and weak. Inquired if she would like to have physical therapy see her and she declined.

## 2015-09-06 ENCOUNTER — Ambulatory Visit: Payer: Medicare Other | Admitting: Oncology

## 2015-09-06 ENCOUNTER — Other Ambulatory Visit: Payer: Self-pay | Admitting: *Deleted

## 2015-09-06 MED ORDER — LIDOCAINE-PRILOCAINE 2.5-2.5 % EX CREA: TOPICAL_CREAM | CUTANEOUS | Status: AC

## 2015-09-06 MED ORDER — HYDROCODONE-ACETAMINOPHEN 5-325 MG PO TABS: 1.0000 | ORAL_TABLET | ORAL | Status: DC | PRN

## 2015-09-06 MED ORDER — LORAZEPAM 0.5 MG PO TABS: 0.5000 mg | ORAL_TABLET | Freq: Every day | ORAL | Status: DC

## 2015-09-06 NOTE — Telephone Encounter (Signed)
Pt's son calling requesting "the tramadol is not effective in keeping her pain under control; is there something stronger she can have to help this pain"  Son states her pain in under her right breast and she take 2-3 tramadol a day.  Per Dr. Benay Spice; notified pt's son that MD has prescribed Vicodin for her pain and also Ativan to help her sleep at night per earlier request.  Son verbalized understanding and knows to p/u prescription by 4 today; confirmed treatment appt for 2/16

## 2015-09-07 ENCOUNTER — Encounter: Payer: Self-pay | Admitting: Nurse Practitioner

## 2015-09-07 ENCOUNTER — Ambulatory Visit (HOSPITAL_BASED_OUTPATIENT_CLINIC_OR_DEPARTMENT_OTHER): Payer: Medicare Other | Admitting: Oncology

## 2015-09-07 ENCOUNTER — Other Ambulatory Visit: Payer: Self-pay | Admitting: *Deleted

## 2015-09-07 ENCOUNTER — Telehealth: Payer: Self-pay | Admitting: Oncology

## 2015-09-07 ENCOUNTER — Encounter: Payer: Self-pay | Admitting: *Deleted

## 2015-09-07 VITALS — BP 109/58 | HR 114 | Temp 98.1°F | Resp 17 | Ht 63.5 in | Wt 144.5 lb

## 2015-09-07 DIAGNOSIS — C251 Malignant neoplasm of body of pancreas: Secondary | ICD-10-CM | POA: Diagnosis present

## 2015-09-07 DIAGNOSIS — D649 Anemia, unspecified: Secondary | ICD-10-CM | POA: Diagnosis not present

## 2015-09-07 DIAGNOSIS — R1110 Vomiting, unspecified: Secondary | ICD-10-CM

## 2015-09-07 DIAGNOSIS — C179 Malignant neoplasm of small intestine, unspecified: Secondary | ICD-10-CM

## 2015-09-07 DIAGNOSIS — C7800 Secondary malignant neoplasm of unspecified lung: Secondary | ICD-10-CM | POA: Diagnosis not present

## 2015-09-07 DIAGNOSIS — C779 Secondary and unspecified malignant neoplasm of lymph node, unspecified: Secondary | ICD-10-CM | POA: Diagnosis not present

## 2015-09-07 DIAGNOSIS — C170 Malignant neoplasm of duodenum: Secondary | ICD-10-CM | POA: Diagnosis not present

## 2015-09-07 NOTE — Progress Notes (Signed)
  [facility] OFFICE PROGRESS NOTE   Diagnosis: Small bowel cancer  INTERVAL HISTORY:   Erica Simpson returns prior to a scheduled visit. We asked her to return for further discussion regarding treatment options. She continues to have malaise and anorexia. Insomnia improved with Ativan. She has pain at the right mid upper abdomen with deep inspiration. She is tolerating a mechanical soft diet.  She underwent placement of a Port-A-Cath to 10 2017 and has attended a chemotherapy teaching class.  Objective:  Vital signs in last 24 hours:  Blood pressure 167/77, pulse 107, temperature 98.3 F (36.8 C), temperature source Oral, resp. rate 17, height 5' 3.5" (1.613 m), weight 146 lb 1.6 oz (66.271 kg), SpO2 99 %.   Resp: Lungs clear bilaterally Cardio: Regular rate and rhythm GI: No apparent ascites, no mass, nontender Vascular: No leg edema Neuro: Alert and oriented   Portacath/PICC-without erythema  Lab Results:  Lab Results  Component Value Date   WBC 4.5 09/01/2015   HGB 8.4* 09/01/2015   HCT 26.8* 09/01/2015   MCV 87.3 09/01/2015   PLT 240 09/01/2015   NEUTROABS 3.2 09/01/2015   09/01/2015: CA 19-9-14, CEA-21.6   Medications: I have reviewed the patient's current medications.  Assessment/Plan: 1. Adenocarcinoma involving a duodenal mass  CT 08/27/2015 consistent with a duodenal mass/uncinate mass, liver metastases, pulmonary metastases, and metastatic lymphadenopathy  2. Anorexia/weight loss  3. Intermittent vomiting secondary to the duodenal mass  4. Polycystic kidney disease/renal failure  5.   Anemia    Disposition:  Erica Simpson appears stable. I reviewed the CT images with a radiologist and discussed the case with Dr. Fuller Plan. Her case was presented at the GI tumor conference this morning. The bulk of the primary tumor appears to be centered in the small bowel. The CA-19-9 is normal. The consensus opinion from the GI tumor conference is  she most likely has a duodenal primary.  Erica Simpson understands the only way to know for sure whether her tumor represents a pancreas were duodenal primary would be to undergo surgery. She is not a candidate for surgery.  I discussed treatment options with Erica Simpson and her family. I recommend FOLFIRI chemotherapy. We are reluctant to administer FOLFOX in the setting of advanced renal failure. I reviewed the potential toxicities associated with FOLFIRI regimen including the chance for nausea/vomiting, mucositis, diarrhea, alopecia, and hematologic toxicity. We discussed the rash, hyperpigmentation, some sensitivity, and hand/foot syndrome associated with 5-FU. We reviewed the diarrhea and cholinergic syndrome seen with irinotecan. She agrees to proceed.  The malaise is likely related to the metastatic small bowel carcinoma, though anemia may be continuing. We will arrange for a red cell transfusion if the hemoglobin falls further. We will also consider initiating a trial of erythropoietin therapy.  She is scheduled for a first cycle of FOLFIRI 09/08/2015. I will dose reduce the chemotherapy secondary to her borderline performance status.  Betsy Coder, MD  09/07/2015  11:35 AM

## 2015-09-07 NOTE — Telephone Encounter (Signed)
Appointments made per 2/15 pof

## 2015-09-07 NOTE — Progress Notes (Signed)
Oncology Nurse Navigator Documentation  Oncology Nurse Navigator Flowsheets 09/07/2015  Navigator Location [facility]  Navigator Encounter Type Other  Telephone -  Abnormal Finding Date -  Confirmed Diagnosis Date -  Patient Visit Type MedOnc  Treatment Phase Pre-Tx/Tx Discussion  Lawyer; Symptom Management  Interventions Education Method--  Referrals -  Education Method Verbal;Written;Teach-back  Support Groups/Services -  Acuity -  Time Spent with Patient 45  Treatment plan change after discussion in tumor board today: feel her cancer is primary small bowel with involvement of uncinate process. Will receive FOLFIRI instead of Gem/Abraxane. Provided patient/family with handouts on drugs and reviewed potential side effects and management. Reviewed Imodium regimen for diarrhea to call if not resolved in 24 hours. All questions answered. Explained Saturday pump d/c procedure and showed patient an infusion pump. Managed care obtained insurance approval for change.

## 2015-09-08 ENCOUNTER — Encounter: Payer: Self-pay | Admitting: *Deleted

## 2015-09-08 ENCOUNTER — Ambulatory Visit (HOSPITAL_BASED_OUTPATIENT_CLINIC_OR_DEPARTMENT_OTHER): Payer: Medicare Other

## 2015-09-08 ENCOUNTER — Ambulatory Visit: Payer: Medicare Other | Admitting: Oncology

## 2015-09-08 ENCOUNTER — Ambulatory Visit: Payer: Medicare Other | Admitting: Nutrition

## 2015-09-08 VITALS — BP 102/60 | HR 101 | Temp 98.0°F | Resp 20

## 2015-09-08 DIAGNOSIS — C7800 Secondary malignant neoplasm of unspecified lung: Secondary | ICD-10-CM | POA: Diagnosis not present

## 2015-09-08 DIAGNOSIS — Z5111 Encounter for antineoplastic chemotherapy: Secondary | ICD-10-CM

## 2015-09-08 DIAGNOSIS — C784 Secondary malignant neoplasm of small intestine: Secondary | ICD-10-CM

## 2015-09-08 DIAGNOSIS — C179 Malignant neoplasm of small intestine, unspecified: Secondary | ICD-10-CM

## 2015-09-08 DIAGNOSIS — C787 Secondary malignant neoplasm of liver and intrahepatic bile duct: Secondary | ICD-10-CM

## 2015-09-08 MED ORDER — HEPARIN SOD (PORK) LOCK FLUSH 100 UNIT/ML IV SOLN
500.0000 [IU] | Freq: Once | INTRAVENOUS | Status: DC | PRN
  Filled 2015-09-08: qty 5

## 2015-09-08 MED ORDER — SODIUM CHLORIDE 0.9 % IV SOLN
Freq: Once | INTRAVENOUS | Status: AC
  Administered 2015-09-08: 14:00:00 via INTRAVENOUS

## 2015-09-08 MED ORDER — SODIUM CHLORIDE 0.9 % IV SOLN
10.0000 mg | Freq: Once | INTRAVENOUS | Status: AC
  Administered 2015-09-08: 10 mg via INTRAVENOUS
  Filled 2015-09-08: qty 1

## 2015-09-08 MED ORDER — ATROPINE SULFATE 1 MG/ML IJ SOLN: 0.5000 mg | Freq: Once | INTRAMUSCULAR | Status: DC | PRN

## 2015-09-08 MED ORDER — IRINOTECAN HCL CHEMO INJECTION 100 MG/5ML
125.0000 mg/m2 | Freq: Once | INTRAVENOUS | Status: AC
  Administered 2015-09-08: 214 mg via INTRAVENOUS
  Filled 2015-09-08: qty 8.7

## 2015-09-08 MED ORDER — PALONOSETRON HCL INJECTION 0.25 MG/5ML
INTRAVENOUS | Status: AC
  Filled 2015-09-08: qty 5

## 2015-09-08 MED ORDER — SODIUM CHLORIDE 0.9% FLUSH
10.0000 mL | INTRAVENOUS | Status: DC | PRN
  Filled 2015-09-08: qty 10

## 2015-09-08 MED ORDER — LEUCOVORIN CALCIUM INJECTION 350 MG
200.0000 mg/m2 | Freq: Once | INTRAVENOUS | Status: AC
  Administered 2015-09-08: 342 mg via INTRAVENOUS
  Filled 2015-09-08: qty 17.1

## 2015-09-08 MED ORDER — SODIUM CHLORIDE 0.9 % IV SOLN
1680.0000 mg/m2 | INTRAVENOUS | Status: DC
  Administered 2015-09-08: 2850 mg via INTRAVENOUS
  Filled 2015-09-08: qty 57

## 2015-09-08 MED ORDER — PALONOSETRON HCL INJECTION 0.25 MG/5ML
0.2500 mg | Freq: Once | INTRAVENOUS | Status: AC
  Administered 2015-09-08: 0.25 mg via INTRAVENOUS

## 2015-09-08 NOTE — Patient Instructions (Signed)
Fluorouracil, 5-FU injection What is this medicine? FLUOROURACIL, 5-FU (flure oh YOOR a sil) is a chemotherapy drug. It slows the growth of cancer cells. This medicine is used to treat many types of cancer like breast cancer, colon or rectal cancer, pancreatic cancer, and stomach cancer. This medicine may be used for other purposes; ask your health care provider or pharmacist if you have questions. What should I tell my health care provider before I take this medicine? They need to know if you have any of these conditions: -blood disorders -dihydropyrimidine dehydrogenase (DPD) deficiency -infection (especially a virus infection such as chickenpox, cold sores, or herpes) -kidney disease -liver disease -malnourished, poor nutrition -recent or ongoing radiation therapy -an unusual or allergic reaction to fluorouracil, other chemotherapy, other medicines, foods, dyes, or preservatives -pregnant or trying to get pregnant -breast-feeding How should I use this medicine? This drug is given as an infusion or injection into a vein. It is administered in a hospital or clinic by a specially trained health care professional. Talk to your pediatrician regarding the use of this medicine in children. Special care may be needed. Overdosage: If you think you have taken too much of this medicine contact a poison control center or emergency room at once. NOTE: This medicine is only for you. Do not share this medicine with others. What if I miss a dose? It is important not to miss your dose. Call your doctor or health care professional if you are unable to keep an appointment. What may interact with this medicine? -allopurinol -cimetidine -dapsone -digoxin -hydroxyurea -leucovorin -levamisole -medicines for seizures like ethotoin, fosphenytoin, phenytoin -medicines to increase blood counts like filgrastim, pegfilgrastim, sargramostim -medicines that treat or prevent blood clots like warfarin,  enoxaparin, and dalteparin -methotrexate -metronidazole -pyrimethamine -some other chemotherapy drugs like busulfan, cisplatin, estramustine, vinblastine -trimethoprim -trimetrexate -vaccines Talk to your doctor or health care professional before taking any of these medicines: -acetaminophen -aspirin -ibuprofen -ketoprofen -naproxen This list may not describe all possible interactions. Give your health care provider a list of all the medicines, herbs, non-prescription drugs, or dietary supplements you use. Also tell them if you smoke, drink alcohol, or use illegal drugs. Some items may interact with your medicine. What should I watch for while using this medicine? Visit your doctor for checks on your progress. This drug may make you feel generally unwell. This is not uncommon, as chemotherapy can affect healthy cells as well as cancer cells. Report any side effects. Continue your course of treatment even though you feel ill unless your doctor tells you to stop. In some cases, you may be given additional medicines to help with side effects. Follow all directions for their use. Call your doctor or health care professional for advice if you get a fever, chills or sore throat, or other symptoms of a cold or flu. Do not treat yourself. This drug decreases your body's ability to fight infections. Try to avoid being around people who are sick. This medicine may increase your risk to bruise or bleed. Call your doctor or health care professional if you notice any unusual bleeding. Be careful brushing and flossing your teeth or using a toothpick because you may get an infection or bleed more easily. If you have any dental work done, tell your dentist you are receiving this medicine. Avoid taking products that contain aspirin, acetaminophen, ibuprofen, naproxen, or ketoprofen unless instructed by your doctor. These medicines may hide a fever. Do not become pregnant while taking this medicine. Women should    inform their doctor if they wish to become pregnant or think they might be pregnant. There is a potential for serious side effects to an unborn child. Talk to your health care professional or pharmacist for more information. Do not breast-feed an infant while taking this medicine. Men should inform their doctor if they wish to father a child. This medicine may lower sperm counts. Do not treat diarrhea with over the counter products. Contact your doctor if you have diarrhea that lasts more than 2 days or if it is severe and watery. This medicine can make you more sensitive to the sun. Keep out of the sun. If you cannot avoid being in the sun, wear protective clothing and use sunscreen. Do not use sun lamps or tanning beds/booths. What side effects may I notice from receiving this medicine? Side effects that you should report to your doctor or health care professional as soon as possible: -allergic reactions like skin rash, itching or hives, swelling of the face, lips, or tongue -low blood counts - this medicine may decrease the number of white blood cells, red blood cells and platelets. You may be at increased risk for infections and bleeding. -signs of infection - fever or chills, cough, sore throat, pain or difficulty passing urine -signs of decreased platelets or bleeding - bruising, pinpoint red spots on the skin, black, tarry stools, blood in the urine -signs of decreased red blood cells - unusually weak or tired, fainting spells, lightheadedness -breathing problems -changes in vision -chest pain -mouth sores -nausea and vomiting -pain, swelling, redness at site where injected -pain, tingling, numbness in the hands or feet -redness, swelling, or sores on hands or feet -stomach pain -unusual bleeding Side effects that usually do not require medical attention (report to your doctor or health care professional if they continue or are bothersome): -changes in finger or toe  nails -diarrhea -dry or itchy skin -hair loss -headache -loss of appetite -sensitivity of eyes to the light -stomach upset -unusually teary eyes This list may not describe all possible side effects. Call your doctor for medical advice about side effects. You may report side effects to FDA at 1-800-FDA-1088. Where should I keep my medicine? This drug is given in a hospital or clinic and will not be stored at home. NOTE: This sheet is a summary. It may not cover all possible information. If you have questions about this medicine, talk to your doctor, pharmacist, or health care provider.    2016, Elsevier/Gold Standard. (2007-11-12 13:53:16) Leucovorin injection What is this medicine? LEUCOVORIN (loo koe VOR in) is used to prevent or treat the harmful effects of some medicines. This medicine is used to treat anemia caused by a low amount of folic acid in the body. It is also used with 5-fluorouracil (5-FU) to treat colon cancer. This medicine may be used for other purposes; ask your health care provider or pharmacist if you have questions. What should I tell my health care provider before I take this medicine? They need to know if you have any of these conditions: -anemia from low levels of vitamin B-12 in the blood -an unusual or allergic reaction to leucovorin, folic acid, other medicines, foods, dyes, or preservatives -pregnant or trying to get pregnant -breast-feeding How should I use this medicine? This medicine is for injection into a muscle or into a vein. It is given by a health care professional in a hospital or clinic setting. Talk to your pediatrician regarding the use of this medicine in children.   Special care may be needed. Overdosage: If you think you have taken too much of this medicine contact a poison control center or emergency room at once. NOTE: This medicine is only for you. Do not share this medicine with others. What if I miss a dose? This does not apply. What may  interact with this medicine? -capecitabine -fluorouracil -phenobarbital -phenytoin -primidone -trimethoprim-sulfamethoxazole This list may not describe all possible interactions. Give your health care provider a list of all the medicines, herbs, non-prescription drugs, or dietary supplements you use. Also tell them if you smoke, drink alcohol, or use illegal drugs. Some items may interact with your medicine. What should I watch for while using this medicine? Your condition will be monitored carefully while you are receiving this medicine. This medicine may increase the side effects of 5-fluorouracil, 5-FU. Tell your doctor or health care professional if you have diarrhea or mouth sores that do not get better or that get worse. What side effects may I notice from receiving this medicine? Side effects that you should report to your doctor or health care professional as soon as possible: -allergic reactions like skin rash, itching or hives, swelling of the face, lips, or tongue -breathing problems -fever, infection -mouth sores -unusual bleeding or bruising -unusually weak or tired Side effects that usually do not require medical attention (report to your doctor or health care professional if they continue or are bothersome): -constipation or diarrhea -loss of appetite -nausea, vomiting This list may not describe all possible side effects. Call your doctor for medical advice about side effects. You may report side effects to FDA at 1-800-FDA-1088. Where should I keep my medicine? This drug is given in a hospital or clinic and will not be stored at home. NOTE: This sheet is a summary. It may not cover all possible information. If you have questions about this medicine, talk to your doctor, pharmacist, or health care provider.    2016, Elsevier/Gold Standard. (2008-01-13 16:50:29) Irinotecan injection What is this medicine? IRINOTECAN (ir in oh TEE kan ) is a chemotherapy drug. It is used  to treat colon and rectal cancer. This medicine may be used for other purposes; ask your health care provider or pharmacist if you have questions. What should I tell my health care provider before I take this medicine? They need to know if you have any of these conditions: -blood disorders -dehydration -diarrhea -infection (especially a virus infection such as chickenpox, cold sores, or herpes) -liver disease -low blood counts, like low white cell, platelet, or red cell counts -recent or ongoing radiation therapy -an unusual or allergic reaction to irinotecan, sorbitol, other chemotherapy, other medicines, foods, dyes, or preservatives -pregnant or trying to get pregnant -breast-feeding How should I use this medicine? This drug is given as an infusion into a vein. It is administered in a hospital or clinic by a specially trained health care professional. Talk to your pediatrician regarding the use of this medicine in children. Special care may be needed. Overdosage: If you think you have taken too much of this medicine contact a poison control center or emergency room at once. NOTE: This medicine is only for you. Do not share this medicine with others. What if I miss a dose? It is important not to miss your dose. Call your doctor or health care professional if you are unable to keep an appointment. What may interact with this medicine? Do not take this medicine with any of the following medications: -atazanavir -certain medicines for fungal  infections like itraconazole and ketoconazole -St. John's Wort This medicine may also interact with the following medications: -dexamethasone -diuretics -laxatives -medicines for seizures like carbamazepine, mephobarbital, phenobarbital, phenytoin, primidone -medicines to increase blood counts like filgrastim, pegfilgrastim, sargramostim -prochlorperazine -vaccines This list may not describe all possible interactions. Give your health care  provider a list of all the medicines, herbs, non-prescription drugs, or dietary supplements you use. Also tell them if you smoke, drink alcohol, or use illegal drugs. Some items may interact with your medicine. What should I watch for while using this medicine? Your condition will be monitored carefully while you are receiving this medicine. You will need important blood work done while you are taking this medicine. This drug may make you feel generally unwell. This is not uncommon, as chemotherapy can affect healthy cells as well as cancer cells. Report any side effects. Continue your course of treatment even though you feel ill unless your doctor tells you to stop. In some cases, you may be given additional medicines to help with side effects. Follow all directions for their use. You may get drowsy or dizzy. Do not drive, use machinery, or do anything that needs mental alertness until you know how this medicine affects you. Do not stand or sit up quickly, especially if you are an older patient. This reduces the risk of dizzy or fainting spells. Call your doctor or health care professional for advice if you get a fever, chills or sore throat, or other symptoms of a cold or flu. Do not treat yourself. This drug decreases your body's ability to fight infections. Try to avoid being around people who are sick. This medicine may increase your risk to bruise or bleed. Call your doctor or health care professional if you notice any unusual bleeding. Be careful brushing and flossing your teeth or using a toothpick because you may get an infection or bleed more easily. If you have any dental work done, tell your dentist you are receiving this medicine. Avoid taking products that contain aspirin, acetaminophen, ibuprofen, naproxen, or ketoprofen unless instructed by your doctor. These medicines may hide a fever. Do not become pregnant while taking this medicine. Women should inform their doctor if they wish to  become pregnant or think they might be pregnant. There is a potential for serious side effects to an unborn child. Talk to your health care professional or pharmacist for more information. Do not breast-feed an infant while taking this medicine. What side effects may I notice from receiving this medicine? Side effects that you should report to your doctor or health care professional as soon as possible: -allergic reactions like skin rash, itching or hives, swelling of the face, lips, or tongue -low blood counts - this medicine may decrease the number of white blood cells, red blood cells and platelets. You may be at increased risk for infections and bleeding. -signs of infection - fever or chills, cough, sore throat, pain or difficulty passing urine -signs of decreased platelets or bleeding - bruising, pinpoint red spots on the skin, black, tarry stools, blood in the urine -signs of decreased red blood cells - unusually weak or tired, fainting spells, lightheadedness -breathing problems -chest pain -diarrhea -feeling faint or lightheaded, falls -flushing, runny nose, sweating during infusion -mouth sores or pain -pain, swelling, redness or irritation where injected -pain, swelling, warmth in the leg -pain, tingling, numbness in the hands or feet -problems with balance, talking, walking -stomach cramps, pain -trouble passing urine or change in the amount of  urine -vomiting as to be unable to hold down drinks or food -yellowing of the eyes or skin Side effects that usually do not require medical attention (report to your doctor or health care professional if they continue or are bothersome): -constipation -hair loss -headache -loss of appetite -nausea, vomiting -stomach upset This list may not describe all possible side effects. Call your doctor for medical advice about side effects. You may report side effects to FDA at 1-800-FDA-1088. Where should I keep my medicine? This drug is given  in a hospital or clinic and will not be stored at home. NOTE: This sheet is a summary. It may not cover all possible information. If you have questions about this medicine, talk to your doctor, pharmacist, or health care provider.    2016, Elsevier/Gold Standard. (2013-01-05 16:29:32)

## 2015-09-08 NOTE — Progress Notes (Signed)
Per Dr Benay Spice, do not increase rate of 5FU in CADD.  Pt to come in at 1:30 on Saturday for disconnect.  Nurse to waste remainder of chemo.

## 2015-09-08 NOTE — Progress Notes (Signed)
Oncology Nurse Navigator Documentation  Oncology Nurse Navigator Flowsheets 09/08/2015  Navigator Location [facility]  Navigator Encounter Type Treatment  Telephone -  Abnormal Finding Date -  Confirmed Diagnosis Date -  Treatment Initiated Date 09/08/2015  Patient Visit Type MedOnc  Treatment Phase First Chemo Tx: FOLFOX  Barriers/Navigation Needs No Questions;No Needs  Education -  Interventions None required-dietician to see in infusion area today  Referrals -  Education Method -  Support Groups/Services -  Acuity Level 1  Time Spent with Patient 15

## 2015-09-08 NOTE — Progress Notes (Signed)
[place] Work  Clinical Social Work was referred by patient's son  for assessment of psychosocial needs due to care giver needs/request for aide.  Clinical Social Worker met with patient, son and daughter in the infusion room to offer support and assess for needs.  Pt lives alone, wants help at night in case she needs assistance. CSW reviewed levels of care, what is covered/not covered by insurance with pt and family. CSW also discussed resources for assistance. Pt and family aware this level of care is paid for privately. CSW emailed CNA list to son to further assist with locating provider. They were appreciative and agree to reach out as needed.   Clinical Social Work interventions: REsource assistance Loren Racer, [place] Worker [place]  [place] Phone: [phone] Fax: [phone]

## 2015-09-08 NOTE — Progress Notes (Signed)
80 year old female diagnosed with cancer of the pancreas.  She is a patient of Dr. Julieanne Manson.  Past medical history includes diverticulosis, polycystic kidney disease, GERD, and hypertension.  Medications include vitamin D 3, Prilosec, Zofran, and MiraLAX.  Labs include albumin 2.9.  Height: 63.5 inches. Weight: 144.5 pounds. BMI: 25.19.  Patient denies nutrition impact symptoms. States appetite might be somewhat poor but she does feel hungry. States she drinks several Ensure Plus daily. She enjoys apple juice.  Nutrition diagnosis:  Food and nutrition related knowledge deficit related to pancreas cancer and associated treatments as evidenced by no prior need for nutrition related information.  Intervention: Educated patient to consume small frequent meals and snacks with high-calorie, high-protein foods. Reviewed high-calorie high-protein fact sheet Reviewed appropriate snacks. Encouraged patient to continue Ensure Plus as needed 1-2 daily. Encouraged patient to consume adequate hydration. Provided coupons for oral nutrition supplements. Questions were answered.  Teach back method used.  Contact information provided.  Monitoring, evaluation, goals: Patient will tolerate adequate calories and protein.  Minimal weight loss.  Next visit: Thursday, March 2, during infusion.  **Disclaimer: This note was dictated with voice recognition software. Similar sounding words can inadvertently be transcribed and this note may contain transcription errors which may not have been corrected upon publication of note.**

## 2015-09-10 ENCOUNTER — Encounter: Payer: Self-pay | Admitting: Oncology

## 2015-09-10 ENCOUNTER — Ambulatory Visit (HOSPITAL_BASED_OUTPATIENT_CLINIC_OR_DEPARTMENT_OTHER): Payer: Medicare Other

## 2015-09-10 VITALS — BP 103/57 | HR 114 | Temp 97.8°F | Resp 20

## 2015-09-10 DIAGNOSIS — Z452 Encounter for adjustment and management of vascular access device: Secondary | ICD-10-CM | POA: Diagnosis present

## 2015-09-10 DIAGNOSIS — C170 Malignant neoplasm of duodenum: Secondary | ICD-10-CM | POA: Diagnosis not present

## 2015-09-10 DIAGNOSIS — C179 Malignant neoplasm of small intestine, unspecified: Secondary | ICD-10-CM

## 2015-09-10 MED ORDER — HEPARIN SOD (PORK) LOCK FLUSH 100 UNIT/ML IV SOLN
500.0000 [IU] | Freq: Once | INTRAVENOUS | Status: AC | PRN
  Administered 2015-09-10: 500 [IU]
  Filled 2015-09-10: qty 5

## 2015-09-10 MED ORDER — SODIUM CHLORIDE 0.9% FLUSH
10.0000 mL | INTRAVENOUS | Status: DC | PRN
  Administered 2015-09-10: 10 mL
  Filled 2015-09-10: qty 10

## 2015-09-12 ENCOUNTER — Telehealth: Payer: Self-pay | Admitting: *Deleted

## 2015-09-12 NOTE — Telephone Encounter (Signed)
Oncology Nurse Navigator Documentation  Oncology Nurse Navigator Flowsheets 09/12/2015  Navigator Location [facility]  Navigator Encounter Type Telephone  Telephone Incoming Call;Outgoing Call;Patient Update :son is concerned that patient is depressed  Abnormal Finding Date -  Confirmed Diagnosis Date -  Treatment Initiated Date -  Patient Visit Type -  Treatment Phase -  Barriers/Navigation Needs -  Education -  Interventions Referrals  Referrals Social Work--requested to see patient after her 2/24 visit  Education Method -  Support Groups/Services -  Acuity Level 2  Time Spent with Patient 11  Son called concerned that his mom may also be depressed, besides anemic. She gets up in the morning, but after just 2-3 hours wants to go back to her bed. Won't sit in the recliner, but wants to go into her room alone. Confirmed that depression is very likely and we can discuss this with her at next appointment. She may do well with mirtazapine, which is an antidepressant used with elderly. Sent message requesting the CSW meet with her Friday after the appointment, and assured son that navigator would be present.

## 2015-09-13 ENCOUNTER — Ambulatory Visit (HOSPITAL_BASED_OUTPATIENT_CLINIC_OR_DEPARTMENT_OTHER): Payer: Medicare Other | Admitting: Nurse Practitioner

## 2015-09-13 ENCOUNTER — Other Ambulatory Visit: Payer: Self-pay | Admitting: *Deleted

## 2015-09-13 ENCOUNTER — Encounter (HOSPITAL_COMMUNITY): Payer: Self-pay | Admitting: *Deleted

## 2015-09-13 ENCOUNTER — Inpatient Hospital Stay (HOSPITAL_COMMUNITY): Payer: Medicare Other

## 2015-09-13 ENCOUNTER — Other Ambulatory Visit (HOSPITAL_BASED_OUTPATIENT_CLINIC_OR_DEPARTMENT_OTHER): Payer: Medicare Other

## 2015-09-13 ENCOUNTER — Telehealth: Payer: Self-pay | Admitting: *Deleted

## 2015-09-13 ENCOUNTER — Inpatient Hospital Stay (HOSPITAL_COMMUNITY)
Admission: AD | Admit: 2015-09-13 | Discharge: 2015-09-23 | DRG: 682 | Disposition: A | Discharge status: Skilled Nursing Facility | Payer: Medicare Other | Source: Ambulatory Visit | Attending: Internal Medicine | Admitting: Internal Medicine

## 2015-09-13 ENCOUNTER — Telehealth: Payer: Self-pay | Admitting: Nurse Practitioner

## 2015-09-13 VITALS — BP 111/64 | HR 96 | Temp 97.7°F | Resp 20 | Ht 63.5 in | Wt 141.0 lb

## 2015-09-13 DIAGNOSIS — C787 Secondary malignant neoplasm of liver and intrahepatic bile duct: Secondary | ICD-10-CM | POA: Diagnosis present

## 2015-09-13 DIAGNOSIS — R627 Adult failure to thrive: Secondary | ICD-10-CM | POA: Diagnosis present

## 2015-09-13 DIAGNOSIS — Z8249 Family history of ischemic heart disease and other diseases of the circulatory system: Secondary | ICD-10-CM

## 2015-09-13 DIAGNOSIS — D61810 Antineoplastic chemotherapy induced pancytopenia: Secondary | ICD-10-CM | POA: Diagnosis present

## 2015-09-13 DIAGNOSIS — I129 Hypertensive chronic kidney disease with stage 1 through stage 4 chronic kidney disease, or unspecified chronic kidney disease: Secondary | ICD-10-CM | POA: Diagnosis present

## 2015-09-13 DIAGNOSIS — T451X5A Adverse effect of antineoplastic and immunosuppressive drugs, initial encounter: Secondary | ICD-10-CM | POA: Diagnosis present

## 2015-09-13 DIAGNOSIS — C7800 Secondary malignant neoplasm of unspecified lung: Secondary | ICD-10-CM | POA: Diagnosis present

## 2015-09-13 DIAGNOSIS — C179 Malignant neoplasm of small intestine, unspecified: Secondary | ICD-10-CM

## 2015-09-13 DIAGNOSIS — K219 Gastro-esophageal reflux disease without esophagitis: Secondary | ICD-10-CM

## 2015-09-13 DIAGNOSIS — C779 Secondary and unspecified malignant neoplasm of lymph node, unspecified: Secondary | ICD-10-CM | POA: Diagnosis present

## 2015-09-13 DIAGNOSIS — R1110 Vomiting, unspecified: Secondary | ICD-10-CM | POA: Diagnosis not present

## 2015-09-13 DIAGNOSIS — Z9049 Acquired absence of other specified parts of digestive tract: Secondary | ICD-10-CM | POA: Diagnosis not present

## 2015-09-13 DIAGNOSIS — D631 Anemia in chronic kidney disease: Secondary | ICD-10-CM | POA: Diagnosis not present

## 2015-09-13 DIAGNOSIS — N179 Acute kidney failure, unspecified: Secondary | ICD-10-CM | POA: Diagnosis present

## 2015-09-13 DIAGNOSIS — N183 Chronic kidney disease, stage 3 unspecified: Secondary | ICD-10-CM | POA: Diagnosis present

## 2015-09-13 DIAGNOSIS — D701 Agranulocytosis secondary to cancer chemotherapy: Secondary | ICD-10-CM | POA: Diagnosis not present

## 2015-09-13 DIAGNOSIS — Z79891 Long term (current) use of opiate analgesic: Secondary | ICD-10-CM

## 2015-09-13 DIAGNOSIS — A047 Enterocolitis due to Clostridium difficile: Secondary | ICD-10-CM | POA: Diagnosis not present

## 2015-09-13 DIAGNOSIS — C170 Malignant neoplasm of duodenum: Secondary | ICD-10-CM

## 2015-09-13 DIAGNOSIS — Q613 Polycystic kidney, unspecified: Secondary | ICD-10-CM

## 2015-09-13 DIAGNOSIS — R112 Nausea with vomiting, unspecified: Secondary | ICD-10-CM

## 2015-09-13 DIAGNOSIS — N19 Unspecified kidney failure: Secondary | ICD-10-CM | POA: Diagnosis not present

## 2015-09-13 DIAGNOSIS — Z515 Encounter for palliative care: Secondary | ICD-10-CM | POA: Diagnosis present

## 2015-09-13 DIAGNOSIS — A0472 Enterocolitis due to Clostridium difficile, not specified as recurrent: Secondary | ICD-10-CM | POA: Diagnosis present

## 2015-09-13 DIAGNOSIS — E876 Hypokalemia: Secondary | ICD-10-CM | POA: Diagnosis present

## 2015-09-13 DIAGNOSIS — R197 Diarrhea, unspecified: Secondary | ICD-10-CM | POA: Diagnosis present

## 2015-09-13 DIAGNOSIS — N189 Chronic kidney disease, unspecified: Secondary | ICD-10-CM | POA: Diagnosis not present

## 2015-09-13 DIAGNOSIS — R110 Nausea: Secondary | ICD-10-CM

## 2015-09-13 DIAGNOSIS — Z66 Do not resuscitate: Secondary | ICD-10-CM | POA: Insufficient documentation

## 2015-09-13 DIAGNOSIS — B9620 Unspecified Escherichia coli [E. coli] as the cause of diseases classified elsewhere: Secondary | ICD-10-CM | POA: Diagnosis present

## 2015-09-13 DIAGNOSIS — R509 Fever, unspecified: Secondary | ICD-10-CM | POA: Diagnosis present

## 2015-09-13 DIAGNOSIS — Z7189 Other specified counseling: Secondary | ICD-10-CM | POA: Diagnosis not present

## 2015-09-13 DIAGNOSIS — R7881 Bacteremia: Secondary | ICD-10-CM | POA: Diagnosis present

## 2015-09-13 DIAGNOSIS — Z90710 Acquired absence of both cervix and uterus: Secondary | ICD-10-CM | POA: Diagnosis not present

## 2015-09-13 DIAGNOSIS — Z90721 Acquired absence of ovaries, unilateral: Secondary | ICD-10-CM | POA: Diagnosis not present

## 2015-09-13 DIAGNOSIS — E875 Hyperkalemia: Secondary | ICD-10-CM

## 2015-09-13 DIAGNOSIS — E860 Dehydration: Secondary | ICD-10-CM | POA: Diagnosis present

## 2015-09-13 DIAGNOSIS — I959 Hypotension, unspecified: Secondary | ICD-10-CM | POA: Diagnosis present

## 2015-09-13 DIAGNOSIS — Z79899 Other long term (current) drug therapy: Secondary | ICD-10-CM | POA: Diagnosis not present

## 2015-09-13 DIAGNOSIS — R630 Anorexia: Secondary | ICD-10-CM | POA: Diagnosis present

## 2015-09-13 LAB — COMPREHENSIVE METABOLIC PANEL
ALBUMIN: 2.5 g/dL — AB (ref 3.5–5.0)
ALK PHOS: 304 U/L — AB (ref 38–126)
ALT: 68 U/L — AB (ref 0–55)
ALT: 56 U/L — ABNORMAL HIGH (ref 14–54)
ANION GAP: 12 meq/L — AB (ref 3–11)
ANION GAP: 12 (ref 5–15)
AST: 67 U/L — AB (ref 5–34)
AST: 56 U/L — ABNORMAL HIGH (ref 15–41)
Albumin: 2.7 g/dL — ABNORMAL LOW (ref 3.5–5.0)
Alkaline Phosphatase: 400 U/L — ABNORMAL HIGH (ref 40–150)
BUN: 69 mg/dL — ABNORMAL HIGH (ref 7.0–26.0)
BUN: 72 mg/dL — ABNORMAL HIGH (ref 6–20)
CHLORIDE: 105 meq/L (ref 98–109)
CHLORIDE: 103 mmol/L (ref 101–111)
CO2: 21 meq/L — AB (ref 22–29)
CO2: 21 mmol/L — AB (ref 22–32)
Calcium: 10.2 mg/dL (ref 8.4–10.4)
Calcium: 9.3 mg/dL (ref 8.9–10.3)
Creatinine, Ser: 2.92 mg/dL — ABNORMAL HIGH (ref 0.44–1.00)
Creatinine: 3 mg/dL (ref 0.6–1.1)
EGFR: 14 mL/min/{1.73_m2} — AB (ref >90)
GFR calc Af Amer: 17 mL/min — ABNORMAL LOW (ref >60)
GFR calc non Af Amer: 14 mL/min — ABNORMAL LOW (ref >60)
GLUCOSE: 131 mg/dL — AB (ref 65–99)
Glucose: 120 mg/dl (ref 70–140)
POTASSIUM: 4.8 mmol/L (ref 3.5–5.1)
SODIUM: 136 mmol/L (ref 135–145)
Sodium: 138 mEq/L (ref 136–145)
Total Bilirubin: 0.66 mg/dL (ref 0.20–1.20)
Total Bilirubin: 0.6 mg/dL (ref 0.3–1.2)
Total Protein: 7 g/dL (ref 6.4–8.3)
Total Protein: 6.1 g/dL — ABNORMAL LOW (ref 6.5–8.1)

## 2015-09-13 LAB — MAGNESIUM: Magnesium: 2.4 mg/dL (ref 1.7–2.4)

## 2015-09-13 LAB — CBC WITH DIFFERENTIAL/PLATELET
BASO%: 0.7 % (ref 0.0–2.0)
BASOS ABS: 0 10*3/uL (ref 0.0–0.1)
Basophils Absolute: 0 10*3/uL (ref 0.0–0.1)
Basophils Relative: 0 %
EOS%: 1 % (ref 0.0–7.0)
Eosinophils Absolute: 0 10*3/uL (ref 0.0–0.5)
Eosinophils Absolute: 0 10*3/uL (ref 0.0–0.7)
Eosinophils Relative: 1 %
HCT: 28 % — ABNORMAL LOW (ref 34.8–46.6)
HEMATOCRIT: 24.1 % — AB (ref 36.0–46.0)
HEMOGLOBIN: 7.6 g/dL — AB (ref 12.0–15.0)
HGB: 9 g/dL — ABNORMAL LOW (ref 11.6–15.9)
LYMPH%: 18.3 % (ref 14.0–49.7)
LYMPHS ABS: 0.6 10*3/uL — AB (ref 0.7–4.0)
LYMPHS PCT: 23 %
MCH: 26.8 pg (ref 25.1–34.0)
MCH: 27 pg (ref 26.0–34.0)
MCHC: 32.1 g/dL (ref 31.5–36.0)
MCHC: 31.5 g/dL (ref 30.0–36.0)
MCV: 83.5 fL (ref 79.5–101.0)
MCV: 85.5 fL (ref 78.0–100.0)
MONO#: 0.1 10*3/uL (ref 0.1–0.9)
MONO%: 1.5 % (ref 0.0–14.0)
MONOS PCT: 1 %
Monocytes Absolute: 0 10*3/uL — ABNORMAL LOW (ref 0.1–1.0)
NEUT#: 2.9 10*3/uL (ref 1.5–6.5)
NEUT%: 78.5 % — AB (ref 38.4–76.8)
NEUTROS ABS: 1.9 10*3/uL (ref 1.7–7.7)
NEUTROS PCT: 75 %
PLATELETS: 310 10*3/uL (ref 145–400)
Platelets: 234 10*3/uL (ref 150–400)
RBC: 3.35 10*6/uL — AB (ref 3.70–5.45)
RBC: 2.82 MIL/uL — AB (ref 3.87–5.11)
RDW: 14.6 % — ABNORMAL HIGH (ref 11.2–14.5)
RDW: 14 % (ref 11.5–15.5)
WBC: 3.7 10*3/uL — ABNORMAL LOW (ref 3.9–10.3)
WBC: 2.6 10*3/uL — AB (ref 4.0–10.5)
lymph#: 0.7 10*3/uL — ABNORMAL LOW (ref 0.9–3.3)

## 2015-09-13 LAB — PROTIME-INR
INR: 1.18 (ref 0.00–1.49)
Prothrombin Time: 14.7 seconds (ref 11.6–15.2)

## 2015-09-13 LAB — URINALYSIS, ROUTINE W REFLEX MICROSCOPIC
Bilirubin Urine: NEGATIVE
GLUCOSE, UA: NEGATIVE mg/dL
Hgb urine dipstick: NEGATIVE
KETONES UR: NEGATIVE mg/dL
LEUKOCYTES UA: NEGATIVE
NITRITE: NEGATIVE
PH: 5.5 (ref 5.0–8.0)
PROTEIN: NEGATIVE mg/dL
Specific Gravity, Urine: 1.013 (ref 1.005–1.030)

## 2015-09-13 LAB — LACTIC ACID, PLASMA: Lactic Acid, Venous: 2.2 mmol/L (ref 0.5–2.0)

## 2015-09-13 MED ORDER — HEPARIN SODIUM (PORCINE) 5000 UNIT/ML IJ SOLN
5000.0000 [IU] | Freq: Three times a day (TID) | INTRAMUSCULAR | Status: DC
  Filled 2015-09-13 (×3): qty 1

## 2015-09-13 MED ORDER — HYDROCODONE-ACETAMINOPHEN 5-325 MG PO TABS: 1.0000 | ORAL_TABLET | ORAL | Status: DC | PRN

## 2015-09-13 MED ORDER — ACETAMINOPHEN 650 MG RE SUPP
650.0000 mg | Freq: Four times a day (QID) | RECTAL | Status: DC | PRN
Start: 2015-09-13 — End: 2015-09-23

## 2015-09-13 MED ORDER — MORPHINE SULFATE (PF) 2 MG/ML IV SOLN: 2.0000 mg | INTRAVENOUS | Status: DC | PRN

## 2015-09-13 MED ORDER — ONDANSETRON HCL 4 MG PO TABS: 4.0000 mg | ORAL_TABLET | Freq: Four times a day (QID) | ORAL | Status: DC | PRN

## 2015-09-13 MED ORDER — SODIUM CHLORIDE 0.9 % IV BOLUS (SEPSIS)
1000.0000 mL | Freq: Once | INTRAVENOUS | Status: AC
  Administered 2015-09-13: 1000 mL via INTRAVENOUS

## 2015-09-13 MED ORDER — PANTOPRAZOLE SODIUM 40 MG IV SOLR
40.0000 mg | Freq: Two times a day (BID) | INTRAVENOUS | Status: DC
  Administered 2015-09-13 – 2015-09-17 (×9): 40 mg via INTRAVENOUS
  Filled 2015-09-13 (×10): qty 40

## 2015-09-13 MED ORDER — ACETAMINOPHEN 325 MG PO TABS
650.0000 mg | ORAL_TABLET | Freq: Four times a day (QID) | ORAL | Status: DC | PRN
  Administered 2015-09-16: 650 mg via ORAL
  Filled 2015-09-13: qty 2

## 2015-09-13 MED ORDER — LORAZEPAM 0.5 MG PO TABS
0.5000 mg | ORAL_TABLET | Freq: Every day | ORAL | Status: DC
  Administered 2015-09-13 – 2015-09-22 (×10): 0.5 mg via ORAL
  Filled 2015-09-13 (×10): qty 1

## 2015-09-13 MED ORDER — ONDANSETRON HCL 4 MG/2ML IJ SOLN
4.0000 mg | Freq: Four times a day (QID) | INTRAMUSCULAR | Status: DC | PRN
  Administered 2015-09-15 – 2015-09-16 (×2): 4 mg via INTRAVENOUS
  Filled 2015-09-13 (×2): qty 2

## 2015-09-13 MED ORDER — SODIUM CHLORIDE 0.9 % IV SOLN
INTRAVENOUS | Status: DC
  Administered 2015-09-13: 22:00:00 via INTRAVENOUS
  Administered 2015-09-13: 125 mL/h via INTRAVENOUS
  Administered 2015-09-14 – 2015-09-15 (×2): via INTRAVENOUS

## 2015-09-13 MED ORDER — TRAMADOL HCL 50 MG PO TABS
50.0000 mg | ORAL_TABLET | Freq: Three times a day (TID) | ORAL | Status: DC | PRN
  Administered 2015-09-16 – 2015-09-20 (×2): 50 mg via ORAL
  Filled 2015-09-13 (×2): qty 1

## 2015-09-13 MED ORDER — SODIUM CHLORIDE 0.9% FLUSH
3.0000 mL | Freq: Two times a day (BID) | INTRAVENOUS | Status: DC
  Administered 2015-09-13 – 2015-09-22 (×8): 3 mL via INTRAVENOUS

## 2015-09-13 NOTE — Progress Notes (Signed)
Pt called regarding lab appt at 2:15PM and appt with L. Thomas NP at 2:45PM.  Pt appreciates phone call and will be here at 2:15.

## 2015-09-13 NOTE — Progress Notes (Addendum)
CRITICAL VALUE ALERT  Critical value received:  Lactic Acid 2.2  Date of notification:  09/13/15  Time of notification:  1905  Critical value read back: yes  Nurse who received alert:  Janann August  MD notified (1st page):  Floor coverage, KIRBY  Time of first page:  1905  MD notified (2nd page):  Time of second page:  Responding MD:    Time MD responded:

## 2015-09-13 NOTE — Telephone Encounter (Signed)
Pt is aware of D/T per 02/21 POF.... Added labs/ov

## 2015-09-13 NOTE — H&P (Signed)
[facility] History and Physical   Patient: Erica Simpson   PCP: Gennette Pac, MD DOB: [birth year]   DOA: 09/13/2015   DOS: 09/13/2015   DOS: the patient was seen and examined on 09/13/2015  Referring physician: Dr. Blair Promise Chief Complaint: Diarrhea  HPI: Erica Simpson is a 80 y.o. female with Past medical history of polycystic kidney disease, chronic kidney disease stage III,. 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 Patient was recently started on chemotherapy she had her first cycle completed on 09/08/2015. She had 2 episodes of loose bowel movement no further bowel movements since then. She denies any abdominal pain. She also complains of increased urinary frequency. She denies any recent change in her medication. She has taken her blood pressure medication this morning.  The patient is coming from home  At her baseline ambulates without support And is independent for most of her ADL; manages her medication on her own.  Review of Systems: as mentioned in the history of present illness.  A comprehensive review of the other systems is negative.  Past Medical History  Diagnosis Date  . Adenomatous colon polyp 02/2001  . Diverticulosis   . Internal hemorrhoids   . Polycystic kidney disease     Stage 4 per patient  . GERD (gastroesophageal reflux disease)   . Hypertension   . Lichen planus    Past Surgical History  Procedure Laterality Date  . Oophorectomy    . Appendectomy    . Cholecystectomy    . Partial hysterectomy     Social History:  reports that she has never smoked. She has never used smokeless tobacco. She reports that she drinks alcohol. She reports that she does not use illicit drugs.  No Known Allergies  Family History  Problem Relation Age of Onset  . Breast cancer Sister   . Heart disease Mother   . Kidney disease Mother   . Colon cancer Neg Hx   .  Kidney disease Father   . Heart disease Father     Prior to Admission medications   Medication Sig Start Date End Date Taking? Authorizing Provider  alendronate (FOSAMAX) 70 MG tablet Take 70 mg by mouth once a week. Take with a full glass of water on an empty stomach.   Yes Historical Provider, MD  amLODipine (NORVASC) 5 MG tablet Take 5 mg by mouth daily.   Yes Historical Provider, MD  bisacodyl (DULCOLAX) 5 MG EC tablet Take 5 mg by mouth as needed for moderate constipation.   Yes Historical Provider, MD  HYDROcodone-acetaminophen (NORCO/VICODIN) 5-325 MG tablet Take 1 tablet by mouth every 4 (four) hours as needed for moderate pain. 09/06/15  Yes Ladell Pier, MD  lidocaine-prilocaine (EMLA) cream Apply small amount over port area 1 hour prior to treatment and cover with plastic wrap.  DO NOT RUB IN 09/06/15  Yes Ladell Pier, MD  loratadine (CLARITIN) 10 MG tablet Take 10 mg by mouth daily.   Yes Historical Provider, MD  LORazepam (ATIVAN) 0.5 MG tablet Take 1 tablet (0.5 mg total) by mouth at bedtime. 09/06/15  Yes Ladell Pier, MD  omeprazole (PRILOSEC) 40 MG capsule Take 40 mg by mouth daily as needed (acid reflux).  08/05/15  Yes Historical Provider, MD  ondansetron (ZOFRAN) 4 MG tablet Take 1 tablet (4 mg total) by mouth every 6 (six) hours. 08/27/15  Yes Jeffrey Hedges, PA-C  traMADol (ULTRAM) 50 MG  tablet Take 1 tablet (50 mg total) by mouth 3 (three) times daily as needed for moderate pain. 08/29/15  Yes Ladene Artist, MD    Physical Exam: Filed Vitals:   09/13/15 1735  BP: 119/58  Pulse: 106  Temp: 97.7 F (36.5 C)  TempSrc: Oral  Resp: 16  Height: 5\' 3"  (1.6 m)  Weight: 62.959 kg (138 lb 12.8 oz)  SpO2: 100%    General: Alert, Awake and Oriented to Time, Place and Person. Appear in moderate distress Eyes: PERRL ENT: Oral Mucosa clear dry. Neck: no JVD Cardiovascular: S1 and S2 Present, no Murmur, Peripheral Pulses Present Respiratory: Bilateral Air entry equal  and Decreased,  Clear to Auscultation, no Crackles, no wheezes Abdomen: Bowel Sound present, Soft and no tenderness Skin: no Rash Extremities: no Pedal edema, no calf tenderness Neurologic: Grossly no focal neuro deficit.  Labs on Admission:  CBC:  Recent Labs Lab 09/13/15 1430 09/13/15 1830  WBC 3.7* 2.6*  NEUTROABS 2.9 1.9  HGB 9.0* 7.6*  HCT 28.0* 24.1*  MCV 83.5 85.5  PLT 310 234    CMP     Component Value Date/Time   NA 136 09/13/2015 1830   NA 138 09/13/2015 1430   K 4.8 09/13/2015 1830   K 5.6 Repeated and Verified, no hemolysis* 09/13/2015 1430   CL 103 09/13/2015 1830   CO2 21* 09/13/2015 1830   CO2 21* 09/13/2015 1430   GLUCOSE 131* 09/13/2015 1830   GLUCOSE 120 09/13/2015 1430   BUN 72* 09/13/2015 1830   BUN 69.0* 09/13/2015 1430   CREATININE 2.92* 09/13/2015 1830   CREATININE 3.0* 09/13/2015 1430   CALCIUM 9.3 09/13/2015 1830   CALCIUM 10.2 09/13/2015 1430   PROT 6.1* 09/13/2015 1830   PROT 7.0 09/13/2015 1430   ALBUMIN 2.5* 09/13/2015 1830   ALBUMIN 2.7* 09/13/2015 1430   AST 56* 09/13/2015 1830   AST 67* 09/13/2015 1430   ALT 56* 09/13/2015 1830   ALT 68* 09/13/2015 1430   ALKPHOS 304* 09/13/2015 1830   ALKPHOS 400* 09/13/2015 1430   BILITOT 0.6 09/13/2015 1830   BILITOT 0.66 09/13/2015 1430   GFRNONAA 14* 09/13/2015 1830   GFRAA 17* 09/13/2015 1830   EKG: pending  Assessment/Plan 1. Acute-on-chronic kidney injury ([place]) Chronic kidney disease stage III secondary to polycystic kidney. The patient is presenting with complaints of 2 episodes of diarrhea as well as increase in urinary frequency. This is been ongoing for last 2 days. Patient denies any active bleeding. Does not have any nausea or vomiting. No abdominal pain.  She had hypokalemia initially but currently has improved. Her renal function has also improved with IV hydration. Her LFTs are also stable. She has mild lactic acid elevation. Most likely prerenal etiology with  dehydration and hypotension. With this I would give her one liter of bolus and continue her on IV hydration. We will monitor her on telemetry overnight.  2. Chemotherapy-induced diarrhea. Patient has received one cycle of FOLFIRI regimen. This regimen is known to cause diarrhea. Patient does not meet criteria for C. difficile at present. The patient has further bowel movements or diarrhea here we will need to rule out C. difficile prior to give her Imodium. Check x-ray of her abdomen.  3. essential hypertension and presentation with hypotension. Blood pressure has been running on a lower side at present I would discontinue amlodipine.  4. Small bowel cancer. Management per oncology.  5. Pancytopenia. Hemoglobin is 7.6 probably after recent chemotherapy. Management per oncology. NO Active  bleeding reported.  Nutrition: Regular diet advance as tolerated DVT Prophylaxis: subcutaneous Heparin  Advance goals of care discussion: Full code   Consults: Oncology  Family Communication: NO family was present at bedside, Disposition: Admitted as inpatient, telemetry unit.  Author: Berle Mull, MD [facility] Pager: [phone] 09/13/2015  If 7PM-7AM, please contact night-coverage [email or web address] Password [email or web address]

## 2015-09-13 NOTE — Progress Notes (Signed)
Patient ID: Erica Simpson, female   DOB: [birth year], 80 y.o.   MRN: [id number]  80 year old female with a history of recently diagnosed small bowel cancer in her duodenum, considered not to be a candidate for surgery, started on chemotherapy by Dr. Benay Spice, on 2/16, presents to the office today with nausea vomiting diarrhea which is a side effect of her chemotherapy. Patient has a history of chronic kidney disease with a baseline creatinine of around 2.5. Patient presents with a creatinine of 3.0, potassium 5.6. Patient is hypotensive in the [id number] systolic. Patient is being admitted for dehydration, diarrhea, renal failure. Patient will receive 1 L of fluid prior to patient transfer to telemetry. If her systolic remains less than 100 patient will be admitted to step down.

## 2015-09-13 NOTE — Telephone Encounter (Signed)
VM from sister that Erica Simpson is very fatigued and pulse goes up when she ambulates. She stays in bed all day. Asking for her to be seen today. Forwarded message to MD.

## 2015-09-13 NOTE — Progress Notes (Addendum)
  [name] OFFICE PROGRESS NOTE   Diagnosis:  Small bowel cancer  INTERVAL HISTORY:   Erica Simpson returns prior to scheduled follow-up. She completed cycle 1 FOLFIRI 09/08/2015. She denies nausea/vomiting. No mouth sores. She began having loose stools this morning. So far she has had 2 loose stools. She took Imodium. She is urinating frequently at nighttime. No dysuria. She feels "tired and weak". No fever. No pain. She denies lightheadedness and dizziness. Appetite is poor. She is tolerating liquids but admits to suboptimal intake of fluids.  Objective:  Vital signs in last 24 hours:  Blood pressure 82/56, pulse 117, temperature 97.7 F (36.5 C), temperature source Oral, resp. rate 20, height 5' 3.5" (1.613 m), weight 141 lb (63.957 kg), SpO2 99 %. repeat blood pressure 111/64, repeat heart rate 96   Chronically ill-appearing elderly female. HEENT: No thrush or ulcers. Mucous membranes appear moist. Resp: Lungs clear bilaterally. Cardio: Regular rate and rhythm. GI: Abdomen soft and nontender. No hepatomegaly. No mass. Vascular: No leg edema. Neuro: Alert and oriented.  Skin: Decreased skin turgor. Port-A-Cath without erythema.    Lab Results:  Lab Results  Component Value Date   WBC 3.7* 09/13/2015   HGB 9.0* 09/13/2015   HCT 28.0* 09/13/2015   MCV 83.5 09/13/2015   PLT 310 09/13/2015   NEUTROABS 2.9 09/13/2015    Imaging:  No results found.  Medications: I have reviewed the patient's current medications.  Assessment/Plan: 1. Adenocarcinoma involving a duodenal mass  CT 08/27/2015 consistent with a duodenal mass/uncinate mass, liver metastases, pulmonary metastases, and metastatic lymphadenopathy  Cycle 1 FOLFIRI 09/08/2015  2. Anorexia/weight loss  3. Intermittent vomiting secondary to the duodenal mass  4. Polycystic kidney disease/renal failure  5. Anemia   Disposition: Erica Simpson completed cycle 1 FOLFIRI 09/08/2015. She  presents with failure to thrive, diarrhea. She was found to be hypotensive and tachycardic on arrival to the [facility]. She is not febrile. She does not appear acutely ill. Repeat blood pressure and heart rate were improved. She is likely dehydrated.   She has chronic kidney disease. Creatinine is increased from baseline. Her potassium is elevated as well.  Hemoglobin is stable but may be falsely elevated due to dehydration. A tube for blood bank was drawn at today's visit.  Dr. Benay Spice has spoken with the hospitalist. She will be admitted to [facility].  Patient seen with Dr. Benay Spice. 30 minutes were spent face-to-face at today's visit with the majority of that time involved in counseling/coordination of care.   Ned Card ANP/GNP-BC   09/13/2015  3:13 PM  This was a shared visit with Ned Card. Erica Simpson presents with failure to thrive following a first cycle of FOLFIRI chemotherapy. She appears dehydrated with hyperkalemia and a markedly elevated creatinine. She will be admitted for intravenous hydration, management of hyperkalemia, and antidiarrheal medications needed. The diarrhea is slightly secondary to chemotherapy.  She lives alone and this is becoming more difficult. She will need skilled nursing facility placement or hired assistance in the home.  I discussed the case with Dr. Allyson Sabal. I appreciate the care from the internal medicine service.  Julieanne Manson, M.D.

## 2015-09-13 NOTE — Progress Notes (Signed)
Pt. Transported to 4E 1417 to receiving Archivist Brief report given.

## 2015-09-14 DIAGNOSIS — N179 Acute kidney failure, unspecified: Principal | ICD-10-CM

## 2015-09-14 DIAGNOSIS — N189 Chronic kidney disease, unspecified: Secondary | ICD-10-CM

## 2015-09-14 DIAGNOSIS — T451X5A Adverse effect of antineoplastic and immunosuppressive drugs, initial encounter: Secondary | ICD-10-CM

## 2015-09-14 DIAGNOSIS — D631 Anemia in chronic kidney disease: Secondary | ICD-10-CM

## 2015-09-14 DIAGNOSIS — C170 Malignant neoplasm of duodenum: Secondary | ICD-10-CM

## 2015-09-14 DIAGNOSIS — C787 Secondary malignant neoplasm of liver and intrahepatic bile duct: Secondary | ICD-10-CM

## 2015-09-14 DIAGNOSIS — C7800 Secondary malignant neoplasm of unspecified lung: Secondary | ICD-10-CM

## 2015-09-14 DIAGNOSIS — D61818 Other pancytopenia: Secondary | ICD-10-CM

## 2015-09-14 DIAGNOSIS — D701 Agranulocytosis secondary to cancer chemotherapy: Secondary | ICD-10-CM

## 2015-09-14 LAB — COMPREHENSIVE METABOLIC PANEL
ALK PHOS: 244 U/L — AB (ref 38–126)
ALT: 43 U/L (ref 14–54)
ANION GAP: 9 (ref 5–15)
AST: 46 U/L — ABNORMAL HIGH (ref 15–41)
Albumin: 2.2 g/dL — ABNORMAL LOW (ref 3.5–5.0)
BILIRUBIN TOTAL: 0.7 mg/dL (ref 0.3–1.2)
BUN: 58 mg/dL — ABNORMAL HIGH (ref 6–20)
CALCIUM: 8.1 mg/dL — AB (ref 8.9–10.3)
CO2: 20 mmol/L — ABNORMAL LOW (ref 22–32)
Chloride: 113 mmol/L — ABNORMAL HIGH (ref 101–111)
Creatinine, Ser: 2.84 mg/dL — ABNORMAL HIGH (ref 0.44–1.00)
GFR, EST AFRICAN AMERICAN: 17 mL/min — AB (ref >60)
GFR, EST NON AFRICAN AMERICAN: 15 mL/min — AB (ref >60)
Glucose, Bld: 98 mg/dL (ref 65–99)
Potassium: 5 mmol/L (ref 3.5–5.1)
SODIUM: 142 mmol/L (ref 135–145)
TOTAL PROTEIN: 5.2 g/dL — AB (ref 6.5–8.1)

## 2015-09-14 LAB — CBC
HEMATOCRIT: 19.1 % — AB (ref 36.0–46.0)
Hemoglobin: 5.8 g/dL — CL (ref 12.0–15.0)
MCH: 26.1 pg (ref 26.0–34.0)
MCHC: 30.4 g/dL (ref 30.0–36.0)
MCV: 86 fL (ref 78.0–100.0)
PLATELETS: 136 10*3/uL — AB (ref 150–400)
RBC: 2.22 MIL/uL — ABNORMAL LOW (ref 3.87–5.11)
RDW: 14.1 % (ref 11.5–15.5)
WBC: 0.8 10*3/uL — AB (ref 4.0–10.5)

## 2015-09-14 LAB — DIFFERENTIAL
BASOS PCT: 0 %
Basophils Absolute: 0 10*3/uL (ref 0.0–0.1)
EOS PCT: 2 %
Eosinophils Absolute: 0 10*3/uL (ref 0.0–0.7)
LYMPHS ABS: 0.6 10*3/uL — AB (ref 0.7–4.0)
Lymphocytes Relative: 63 %
MONO ABS: 0 10*3/uL — AB (ref 0.1–1.0)
Monocytes Relative: 4 %
NEUTROS ABS: 0.2 10*3/uL — AB (ref 1.7–7.7)
Neutrophils Relative %: 31 %

## 2015-09-14 LAB — ABO/RH: ABO/RH(D): A POS

## 2015-09-14 LAB — PREPARE RBC (CROSSMATCH)

## 2015-09-14 MED ORDER — TBO-FILGRASTIM 300 MCG/0.5ML ~~LOC~~ SOSY
300.0000 ug | PREFILLED_SYRINGE | Freq: Once | SUBCUTANEOUS | Status: AC
  Administered 2015-09-14: 300 ug via SUBCUTANEOUS
  Filled 2015-09-14: qty 0.8

## 2015-09-14 MED ORDER — SODIUM CHLORIDE 0.9 % IV SOLN
Freq: Once | INTRAVENOUS | Status: AC
  Administered 2015-09-15: 12:00:00 via INTRAVENOUS

## 2015-09-14 MED ORDER — SODIUM CHLORIDE 0.9% FLUSH
10.0000 mL | INTRAVENOUS | Status: DC | PRN
  Administered 2015-09-14 – 2015-09-23 (×9): 10 mL
  Filled 2015-09-14 (×9): qty 40

## 2015-09-14 NOTE — Progress Notes (Addendum)
IP PROGRESS NOTE  Subjective:   She reports feeling better. No more diarrhea. She reports 1 episode of rectal bleeding with the diarrhea this week.  Objective: Vital signs in last 24 hours: Blood pressure 108/60, pulse 90, temperature 98.3 F (36.8 C), temperature source Oral, resp. rate 18, height 5\' 3"  (1.6 m), weight 143 lb 3.2 oz (64.955 kg), SpO2 97 %.  Intake/Output from previous day: 02/21 0701 - 02/22 0700 In: 2547.9 [I.V.:1547.9] Out: 800 [Urine:800]  Physical Exam:  HEENT: No thrush Lungs: Clear bilaterally Cardiac: Regular rate and rhythm Abdomen: Soft, no hepatomegaly, mild tenderness in the right mid abdomen Extremities: No leg edema   Portacath/PICC-without erythema  Lab Results:  Recent Labs  09/13/15 1830 09/14/15 0645  WBC 2.6* 0.8*  HGB 7.6* 5.8*  HCT 24.1* 19.1*  PLT 234 136*    BMET  Recent Labs  09/13/15 1830 09/14/15 0645  NA 136 142  K 4.8 5.0  CL 103 113*  CO2 21* 20*  GLUCOSE 131* 98  BUN 72* 58*  CREATININE 2.92* 2.84*  CALCIUM 9.3 8.1*    Studies/Results: Acute Abdominal Series  09/13/2015  CLINICAL DATA:  Polycystic kidney disease, stage 3 chronic kidney disease. Small bowel cancer and for sec left chemotherapy. Diarrhea. Nausea and vomiting. EXAM: DG ABDOMEN ACUTE W/ 1V CHEST COMPARISON:  08/26/2015 CT abdomen FINDINGS: Power injectable Port-A-Cath tip: SVC. The small scattered pulmonary nodules shown on abdomen CT are not all that apparent on conventional radiography. Tortuous thoracic aorta. Heart size within normal limits. Left-side-down lateral decubitus view the abdomen demonstrates no free intraperitoneal gas. A few air-fluid levels in nondilated loops of small bowel. Small bowel loops measure up to about 2.6 cm. There is gas throughout much of the colon including the rectum. A small amount of residual barium is present and some scattered sigmoid colon diverticula. No compelling findings of ascites. IMPRESSION: 1. Accentuated  gas and a paucity of formed stool in the colon noted. This might relate to diarrheal process. 2. No dilated bowel to suggest obstruction. No free intraperitoneal gas. 3. The prior tiny pulmonary nodules scattered in the lung bases on prior CT are not readily apparent on conventional radiographs. Electronically Signed   By: Van Clines M.D.   On: 09/13/2015 19:53    Medications: I have reviewed the patient's current medications.  Assessment/Plan: 1. Adenocarcinoma involving a duodenal mass  CT 08/27/2015 consistent with a duodenal mass/uncinate mass, liver metastases, pulmonary metastases, and metastatic lymphadenopathy  Cycle 1 FOLFIRI 09/08/2015  2. Anorexia/weight loss  3. Intermittent vomiting secondary to the duodenal mass  4. Polycystic kidney disease/renal failure  5. Anemia secondary to renal failure, bleeding from the duodenal tumor, and chemotherapy  6.   Neutropenia secondary to chemotherapy  7.   History of diarrhea secondary chemotherapy  Ms. Nghiem is now day 7 following a first cycle of FOLFIRI. She has developed pancytopenia with severe anemia. The anemia may be in part related to GI bleeding. She agrees to a red cell transfusion. We will check a white cell differential today and place her on broad-spectrum intravenous antibiotics for a fever spike. She will begin G-CSF. Ms. Donchez will need to remain hospitalized until the white count recovers. I recommend holding the heparin prophylaxis in the setting of anemia and probable GI bleeding.   LOS: 1 day   Betsy Coder, MD   09/14/2015, 8:45 AM

## 2015-09-14 NOTE — Progress Notes (Signed)
CRITICAL VALUE ALERT  Critical value received:  [id number]  Date of notification:  09/14/2015   Time of notification:  0719  Critical value read back:Yes.    Nurse who received alert:  Horton Chin RN  MD notified (1st page):  Dr. Eliseo Squires  Time of first page:  0720  MD notified (2nd page):  Time of second page:  Responding MD:    Time MD responded:

## 2015-09-14 NOTE — Care Management Note (Signed)
Case Management Note  Patient Details  Name: Erica Simpson MRN: [id number] Date of Birth: November 11, 1935  Subjective/Objective:  80 y/o f admitted w/[id number] Bowel Ca.From home. Onc following.PT cons-await recc.                  Action/Plan:d/c plan home.   Expected Discharge Date:                  Expected Discharge Plan:  Home/Self Care  In-House Referral:     Discharge planning Services  CM Consult  Post Acute Care Choice:    Choice offered to:     DME Arranged:    DME Agency:     HH Arranged:    HH Agency:     Status of Service:  In process, will continue to follow  Medicare Important Message Given:    Date Medicare IM Given:    Medicare IM give by:    Date Additional Medicare IM Given:    Additional Medicare Important Message give by:     If discussed at [place] of Stay Meetings, dates discussed:    Additional Comments:  Dessa Phi, RN 09/14/2015, 2:48 PM

## 2015-09-14 NOTE — Progress Notes (Signed)
PROGRESS NOTE  CAMAS KINSMAN [id number] DOB: Jan 22, 1936 DOA: 09/13/2015 PCP: Gennette Pac, MD  Erica Simpson is a 80 y.o. female with Past medical history of polycystic kidney disease, chronic kidney disease stage III,. 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 Patient was recently started on chemotherapy she had her first cycle completed on 09/08/2015. She had 2 episodes of loose bowel movement no further bowel movements since then. She denies any abdominal pain. She also complains of increased urinary frequency. She denies any recent change in her medication. She has taken her blood pressure medication this morning.  Assessment/Plan: Acute-on-chronic kidney injury ([place]) IVF and recheck -baseline: 2.5  Chemotherapy-induced diarrhea. Patient has received one cycle of FOLFIRI regimen. This regimen is known to cause diarrhea. Patient does not meet criteria for C. difficile at present. The patient has further bowel movements or diarrhea here we will need to rule out C. difficile prior to give her Imodium.  x-ray of her abdomen ok  essential hypertension and presentation with hypotension. Hold home emds  Adenocarcinoma involving a duodenal mass  CT 08/27/2015 consistent with a duodenal mass/uncinate mass, liver metastases, pulmonary metastases, and metastatic lymphadenopathy  Cycle 1 FOLFIRI 09/08/2015 -Management per oncology.  Pancytopenia. -started on G-CSF -if fever spikes, will start vanc/fortaz and get blood cultures -transfuse 2 units PRBC   Code Status: full Family Communication: patient Disposition Plan:    Consultants:  oncology  Procedures:      HPI/Subjective: Asking for ice cream No complaints  Objective: Filed Vitals:   09/14/15 1520 09/14/15 1545  BP: 124/86 120/59  Pulse: 86 82  Temp: 98.3 F (36.8 C) 98.2 F (36.8 C)  Resp: 18 18    Intake/Output  Summary (Last 24 hours) at 09/14/15 1548 Last data filed at 09/14/15 1449  Gross per 24 hour  Intake 2557.92 ml  Output   1350 ml  Net 1207.92 ml   Filed Weights   09/13/15 1735 09/14/15 0518  Weight: 62.959 kg (138 lb 12.8 oz) 64.955 kg (143 lb 3.2 oz)    Exam:   General:  Awake, NAd  Cardiovascular: rrr  Respiratory: clear  Abdomen: +Bs, soft  Musculoskeletal: no edema  Data Reviewed: Basic Metabolic Panel:  Recent Labs Lab 09/13/15 1430 09/13/15 1830 09/14/15 0645  NA 138 136 142  K 5.6 Repeated and Verified, no hemolysis* 4.8 5.0  CL  --  103 113*  CO2 21* 21* 20*  GLUCOSE 120 131* 98  BUN 69.0* 72* 58*  CREATININE 3.0* 2.92* 2.84*  CALCIUM 10.2 9.3 8.1*  MG  --  2.4  --    Liver Function Tests:  Recent Labs Lab 09/13/15 1430 09/13/15 1830 09/14/15 0645  AST 67* 56* 46*  ALT 68* 56* 43  ALKPHOS 400* 304* 244*  BILITOT 0.66 0.6 0.7  PROT 7.0 6.1* 5.2*  ALBUMIN 2.7* 2.5* 2.2*   No results for input(s): LIPASE, AMYLASE in the last 168 hours. No results for input(s): AMMONIA in the last 168 hours. CBC:  Recent Labs Lab 09/13/15 1430 09/13/15 1830 09/14/15 0645  WBC 3.7* 2.6* 0.8*  NEUTROABS 2.9 1.9 0.2*  HGB 9.0* 7.6* 5.8*  HCT 28.0* 24.1* 19.1*  MCV 83.5 85.5 86.0  PLT 310 234 136*   Cardiac Enzymes: No results for input(s): CKTOTAL, CKMB, CKMBINDEX, TROPONINI in the last 168 hours. BNP (last 3 results) No results for input(s): BNP in the last 8760 hours.  ProBNP (last 3  results) No results for input(s): PROBNP in the last 8760 hours.  CBG: No results for input(s): GLUCAP in the last 168 hours.  No results found for this or any previous visit (from the past 240 hour(s)).   Studies: Acute Abdominal Series  09-21-2015  CLINICAL DATA:  Polycystic kidney disease, stage 3 chronic kidney disease. Small bowel cancer and for sec left chemotherapy. Diarrhea. Nausea and vomiting. EXAM: DG ABDOMEN ACUTE W/ 1V CHEST COMPARISON:  08/26/2015  CT abdomen FINDINGS: Power injectable Port-A-Cath tip: SVC. The small scattered pulmonary nodules shown on abdomen CT are not all that apparent on conventional radiography. Tortuous thoracic aorta. Heart size within normal limits. Left-side-down lateral decubitus view the abdomen demonstrates no free intraperitoneal gas. A few air-fluid levels in nondilated loops of small bowel. Small bowel loops measure up to about 2.6 cm. There is gas throughout much of the colon including the rectum. A small amount of residual barium is present and some scattered sigmoid colon diverticula. No compelling findings of ascites. IMPRESSION: 1. Accentuated gas and a paucity of formed stool in the colon noted. This might relate to diarrheal process. 2. No dilated bowel to suggest obstruction. No free intraperitoneal gas. 3. The prior tiny pulmonary nodules scattered in the lung bases on prior CT are not readily apparent on conventional radiographs. Electronically Signed   By: Van Clines M.D.   On: Sep 21, 2015 19:53    Scheduled Meds: . sodium chloride   Intravenous Once  . LORazepam  0.5 mg Oral QHS  . pantoprazole (PROTONIX) IV  40 mg Intravenous Q12H  . sodium chloride flush  3 mL Intravenous Q12H   Continuous Infusions: . sodium chloride 125 mL/hr at 09/14/15 0545   Antibiotics Given (last 72 hours)    None      Principal Problem:   Acute-on-chronic kidney injury ([place]) Active Problems:   GERD   Small bowel cancer ([place])   Hypotension   Dehydration   Hyperkalemia   CKD (chronic kidney disease) stage 3, GFR 30-59 ml/min   Diarrhea   Polycystic kidney disease    Time spent: 35 min    [facility] Pager [phone] If 7PM-7AM, please contact night-coverage at [email or web address], password [facility] 09/14/2015, 3:48 PM  LOS: 1 day

## 2015-09-15 ENCOUNTER — Telehealth: Payer: Self-pay | Admitting: *Deleted

## 2015-09-15 DIAGNOSIS — D6489 Other specified anemias: Secondary | ICD-10-CM

## 2015-09-15 DIAGNOSIS — R197 Diarrhea, unspecified: Secondary | ICD-10-CM

## 2015-09-15 DIAGNOSIS — R630 Anorexia: Secondary | ICD-10-CM

## 2015-09-15 DIAGNOSIS — D709 Neutropenia, unspecified: Secondary | ICD-10-CM

## 2015-09-15 DIAGNOSIS — R634 Abnormal weight loss: Secondary | ICD-10-CM

## 2015-09-15 DIAGNOSIS — C779 Secondary and unspecified malignant neoplasm of lymph node, unspecified: Secondary | ICD-10-CM

## 2015-09-15 LAB — HEMOGLOBIN AND HEMATOCRIT, BLOOD
HEMATOCRIT: 26.2 % — AB (ref 36.0–46.0)
HEMOGLOBIN: 8.7 g/dL — AB (ref 12.0–15.0)

## 2015-09-15 LAB — BASIC METABOLIC PANEL
ANION GAP: 9 (ref 5–15)
BUN: 46 mg/dL — AB (ref 6–20)
CO2: 18 mmol/L — ABNORMAL LOW (ref 22–32)
Calcium: 7.9 mg/dL — ABNORMAL LOW (ref 8.9–10.3)
Chloride: 114 mmol/L — ABNORMAL HIGH (ref 101–111)
Creatinine, Ser: 2.59 mg/dL — ABNORMAL HIGH (ref 0.44–1.00)
GFR, EST AFRICAN AMERICAN: 19 mL/min — AB (ref >60)
GFR, EST NON AFRICAN AMERICAN: 17 mL/min — AB (ref >60)
Glucose, Bld: 106 mg/dL — ABNORMAL HIGH (ref 65–99)
POTASSIUM: 4.5 mmol/L (ref 3.5–5.1)
SODIUM: 141 mmol/L (ref 135–145)

## 2015-09-15 LAB — CBC WITH DIFFERENTIAL/PLATELET
BASOS ABS: 0 10*3/uL (ref 0.0–0.1)
Basophils Relative: 0 %
EOS ABS: 0 10*3/uL (ref 0.0–0.7)
Eosinophils Relative: 3 %
HCT: 27.1 % — ABNORMAL LOW (ref 36.0–46.0)
Hemoglobin: 8.9 g/dL — ABNORMAL LOW (ref 12.0–15.0)
LYMPHS PCT: 84 %
Lymphs Abs: 0.3 10*3/uL — ABNORMAL LOW (ref 0.7–4.0)
MCH: 28.7 pg (ref 26.0–34.0)
MCHC: 32.8 g/dL (ref 30.0–36.0)
MCV: 87.4 fL (ref 78.0–100.0)
MONO ABS: 0 10*3/uL — AB (ref 0.1–1.0)
Monocytes Relative: 0 %
NEUTROS PCT: 13 %
Neutro Abs: 0.1 10*3/uL — ABNORMAL LOW (ref 1.7–7.7)
PLATELETS: 121 10*3/uL — AB (ref 150–400)
RBC: 3.1 MIL/uL — AB (ref 3.87–5.11)
RDW: 14.1 % (ref 11.5–15.5)
WBC: 0.4 10*3/uL — AB (ref 4.0–10.5)

## 2015-09-15 LAB — TYPE AND SCREEN
ABO/RH(D): A POS
Antibody Screen: NEGATIVE
Unit division: 0
Unit division: 0

## 2015-09-15 MED ORDER — TBO-FILGRASTIM 300 MCG/0.5ML ~~LOC~~ SOSY
300.0000 ug | PREFILLED_SYRINGE | Freq: Every day | SUBCUTANEOUS | Status: DC
  Administered 2015-09-15 – 2015-09-20 (×6): 300 ug via SUBCUTANEOUS
  Filled 2015-09-15 (×7): qty 0.5

## 2015-09-15 MED ORDER — ALUM & MAG HYDROXIDE-SIMETH 200-200-20 MG/5ML PO SUSP: 15.0000 mL | ORAL | Status: DC | PRN

## 2015-09-15 NOTE — Telephone Encounter (Signed)
Oncology Nurse Navigator Documentation  Oncology Nurse Navigator Flowsheets 09/15/2015  Navigator Location [facility]  Navigator Encounter Type Telephone  Telephone Outgoing Call;Patient Update  Abnormal Finding Date -  Confirmed Diagnosis Date -  Treatment Initiated Date -  Patient Visit Type -  Treatment Phase -  Barriers/Navigation Needs Coordination of Care--spoke with Dr. Benay Spice and obtained order for PT referral--verbal order called to nurse Pam  Education -  Interventions Coordination of Care  Referrals -  Coordination of Care PT Referral  Education Method -  Support Groups/Services -  Acuity -  Time Spent with Patient 15  Spoke w/sister who feels she is not any better despite improvement in her Hgb. Eating, but vomited after breakfast small amount. Was given antiemetic. Now on neutropenic precautions. Sister is concerned that all she wants to do is sleep-won't get up. Consult w/MD who agreed PT referral in order. Will follow after discharge.

## 2015-09-15 NOTE — Progress Notes (Signed)
PT Cancellation Note  Patient Details Name: Erica Simpson MRN: [id number] DOB: 02-Apr-1936   Cancelled Treatment:    Reason Eval/Treat Not Completed: Fatigue/lethargy limiting ability to participate Pt declines mobility at this time.  She states she was shivering earlier and just became warm.  Would like PT to check back tomorrow.   Bryahna Lesko,KATHrine E 09/15/2015, 10:39 AM Carmelia Bake, PT, DPT 09/15/2015 Pager: [phone]

## 2015-09-15 NOTE — Progress Notes (Signed)
PROGRESS NOTE  Erica Simpson [id number] DOB: [birth year] DOA: 09/13/2015 PCP: Gennette Pac, MD  Erica Simpson is a 80 y.o. female with Past medical history of polycystic kidney disease, chronic kidney disease stage III,. 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 Patient was recently started on chemotherapy she had her first cycle completed on 09/08/2015. She had 2 episodes of loose bowel movement no further bowel movements since then.    Assessment/Plan: Acute-on-chronic kidney injury ([place]) -baseline: 2.5 -encourage PO intake  Chemotherapy-induced diarrhea. Patient has received one cycle of FOLFIRI regimen. This regimen is known to cause diarrhea. Patient does not meet criteria for C. difficile at present.  x-ray of her abdomen ok  essential hypertension and presentation with hypotension. Hold home meds  Adenocarcinoma involving a duodenal mass  CT 08/27/2015 consistent with a duodenal mass/uncinate mass, liver metastases, pulmonary metastases, and metastatic lymphadenopathy  Cycle 1 FOLFIRI 09/08/2015 -Management per oncology.  Pancytopenia- side affect of chemo -started on G-CSF -if fever spikes, will start vanc/fortaz and get blood cultures -transfused 2 units PRBC   Code Status: full Family Communication: patient Disposition Plan:    Consultants:  oncology  Procedures:      HPI/Subjective: C/o of acid reflux type symptoms  Objective: Filed Vitals:   09/14/15 2258 09/15/15 0501  BP: 121/66 131/64  Pulse: 86 87  Temp: 98.7 F (37.1 C) 98.8 F (37.1 C)  Resp: 18 20    Intake/Output Summary (Last 24 hours) at 09/15/15 0848 Last data filed at 09/15/15 0700  Gross per 24 hour  Intake   3691 ml  Output   1850 ml  Net   1841 ml   Filed Weights   09/13/15 1735 09/14/15 0518 09/15/15 0501  Weight: 62.959 kg (138 lb 12.8 oz) 64.955 kg (143 lb 3.2 oz) 66.7 kg (147  lb 0.8 oz)    Exam:   General:  Awake, mildly uncomfortable  Cardiovascular: rrr  Respiratory: clear  Abdomen: +Bs, soft  Musculoskeletal: no edema  Data Reviewed: Basic Metabolic Panel:  Recent Labs Lab 09/13/15 1430 09/13/15 1830 09/14/15 0645 09/15/15 0526  NA 138 136 142 141  K 5.6 Repeated and Verified, no hemolysis* 4.8 5.0 4.5  CL  --  103 113* 114*  CO2 21* 21* 20* 18*  GLUCOSE 120 131* 98 106*  BUN 69.0* 72* 58* 46*  CREATININE 3.0* 2.92* 2.84* 2.59*  CALCIUM 10.2 9.3 8.1* 7.9*  MG  --  2.4  --   --    Liver Function Tests:  Recent Labs Lab 09/13/15 1430 09/13/15 1830 09/14/15 0645  AST 67* 56* 46*  ALT 68* 56* 43  ALKPHOS 400* 304* 244*  BILITOT 0.66 0.6 0.7  PROT 7.0 6.1* 5.2*  ALBUMIN 2.7* 2.5* 2.2*   No results for input(s): LIPASE, AMYLASE in the last 168 hours. No results for input(s): AMMONIA in the last 168 hours. CBC:  Recent Labs Lab 09/13/15 1430 09/13/15 1830 09/14/15 0645 09/14/15 2356 09/15/15 0526  WBC 3.7* 2.6* 0.8*  --  0.4*  NEUTROABS 2.9 1.9 0.2*  --  0.1*  HGB 9.0* 7.6* 5.8* 8.7* 8.9*  HCT 28.0* 24.1* 19.1* 26.2* 27.1*  MCV 83.5 85.5 86.0  --  87.4  PLT 310 234 136*  --  121*   Cardiac Enzymes: No results for input(s): CKTOTAL, CKMB, CKMBINDEX, TROPONINI in the last 168 hours. BNP (last 3 results) No results for input(s): BNP in the last 8760  hours.  ProBNP (last 3 results) No results for input(s): PROBNP in the last 8760 hours.  CBG: No results for input(s): GLUCAP in the last 168 hours.  No results found for this or any previous visit (from the past 240 hour(s)).   Studies: Acute Abdominal Series  October 09, 2015  CLINICAL DATA:  Polycystic kidney disease, stage 3 chronic kidney disease. Small bowel cancer and for sec left chemotherapy. Diarrhea. Nausea and vomiting. EXAM: DG ABDOMEN ACUTE W/ 1V CHEST COMPARISON:  08/26/2015 CT abdomen FINDINGS: Power injectable Port-A-Cath tip: SVC. The small scattered  pulmonary nodules shown on abdomen CT are not all that apparent on conventional radiography. Tortuous thoracic aorta. Heart size within normal limits. Left-side-down lateral decubitus view the abdomen demonstrates no free intraperitoneal gas. A few air-fluid levels in nondilated loops of small bowel. Small bowel loops measure up to about 2.6 cm. There is gas throughout much of the colon including the rectum. A small amount of residual barium is present and some scattered sigmoid colon diverticula. No compelling findings of ascites. IMPRESSION: 1. Accentuated gas and a paucity of formed stool in the colon noted. This might relate to diarrheal process. 2. No dilated bowel to suggest obstruction. No free intraperitoneal gas. 3. The prior tiny pulmonary nodules scattered in the lung bases on prior CT are not readily apparent on conventional radiographs. Electronically Signed   By: Van Clines M.D.   On: Oct 09, 2015 19:53    Scheduled Meds: . sodium chloride   Intravenous Once  . LORazepam  0.5 mg Oral QHS  . pantoprazole (PROTONIX) IV  40 mg Intravenous Q12H  . sodium chloride flush  3 mL Intravenous Q12H   Continuous Infusions: . sodium chloride 125 mL/hr at 09/15/15 0356   Antibiotics Given (last 72 hours)    None      Principal Problem:   Acute-on-chronic kidney injury ([place]) Active Problems:   GERD   Small bowel cancer (HCC)   Hypotension   Dehydration   Hyperkalemia   CKD (chronic kidney disease) stage 3, GFR 30-59 ml/min   Diarrhea   Polycystic kidney disease   Chemotherapy-induced neutropenia ([place])    Time spent: 25 min    [facility] Pager [phone] If 7PM-7AM, please contact night-coverage at [email or web address], password [facility] 09/15/2015, 8:48 AM  LOS: 2 days

## 2015-09-15 NOTE — Progress Notes (Signed)
IP PROGRESS NOTE  Subjective:   She had a few episodes of diarrhea. No other complaint. She is eating breakfast this morning.  Objective: Vital signs in last 24 hours: Blood pressure 131/64, pulse 87, temperature 98.8 F (37.1 C), temperature source Oral, resp. rate 20, height 5\' 3"  (1.6 m), weight 147 lb 0.8 oz (66.7 kg), SpO2 99 %.  Intake/Output from previous day: 02/22 0701 - 02/23 0700 In: 3691 [I.V.:3020; Blood:671] Out: 2200 [Urine:2200]  Physical Exam:  HEENT: No thrush Lungs: Clear anteriorly Cardiac: Regular rate and rhythm Abdomen: Soft and nontender Extremities: No leg edema   Portacath/PICC-without erythema  Lab Results:  Recent Labs  09/14/15 0645 09/14/15 2356 09/15/15 0526  WBC 0.8*  --  0.4*  HGB 5.8* 8.7* 8.9*  HCT 19.1* 26.2* 27.1*  PLT 136*  --  121*    BMET  Recent Labs  09/14/15 0645 09/15/15 0526  NA 142 141  K 5.0 4.5  CL 113* 114*  CO2 20* 18*  GLUCOSE 98 106*  BUN 58* 46*  CREATININE 2.84* 2.59*  CALCIUM 8.1* 7.9*    Studies/Results: Acute Abdominal Series  09/13/2015  CLINICAL DATA:  Polycystic kidney disease, stage 3 chronic kidney disease. Small bowel cancer and for sec left chemotherapy. Diarrhea. Nausea and vomiting. EXAM: DG ABDOMEN ACUTE W/ 1V CHEST COMPARISON:  08/26/2015 CT abdomen FINDINGS: Power injectable Port-A-Cath tip: SVC. The small scattered pulmonary nodules shown on abdomen CT are not all that apparent on conventional radiography. Tortuous thoracic aorta. Heart size within normal limits. Left-side-down lateral decubitus view the abdomen demonstrates no free intraperitoneal gas. A few air-fluid levels in nondilated loops of small bowel. Small bowel loops measure up to about 2.6 cm. There is gas throughout much of the colon including the rectum. A small amount of residual barium is present and some scattered sigmoid colon diverticula. No compelling findings of ascites. IMPRESSION: 1. Accentuated gas and a paucity of  formed stool in the colon noted. This might relate to diarrheal process. 2. No dilated bowel to suggest obstruction. No free intraperitoneal gas. 3. The prior tiny pulmonary nodules scattered in the lung bases on prior CT are not readily apparent on conventional radiographs. Electronically Signed   By: Van Clines M.D.   On: 09/13/2015 19:53    Medications: I have reviewed the patient's current medications.  Assessment/Plan: 1. Adenocarcinoma involving a duodenal mass  CT 08/27/2015 consistent with a duodenal mass/uncinate mass, liver metastases, pulmonary metastases, and metastatic lymphadenopathy  Cycle 1 FOLFIRI 09/08/2015  2. Anorexia/weight loss  3. Intermittent vomiting secondary to the duodenal mass  4. Polycystic kidney disease/renal failure  5. Anemia secondary to renal failure, bleeding from the duodenal tumor, and chemotherapy, status post a red cell transfusion 09/14/2015  6.   Neutropenia/thrombocytopenia secondary to chemotherapy-G-CSF started 09/14/2015  7.   History of diarrhea secondary chemotherapy  Ms. Kama appears well today. She has persistent severe neutropenia. The hemoglobin was improved following a red cell transfusion yesterday.  Recommendations: 1. Continue G-CSF, daily CBC 2. Cultures, broad-spectrum antibiotics for a fever spike 3. Increase ambulation as tolerated, physical therapy consult  LOS: 2 days   Betsy Coder, MD   09/15/2015, 2:00 PM

## 2015-09-16 ENCOUNTER — Ambulatory Visit: Payer: Medicare Other | Admitting: Nurse Practitioner

## 2015-09-16 ENCOUNTER — Inpatient Hospital Stay (HOSPITAL_COMMUNITY): Payer: Medicare Other

## 2015-09-16 ENCOUNTER — Other Ambulatory Visit: Payer: Medicare Other

## 2015-09-16 DIAGNOSIS — R509 Fever, unspecified: Secondary | ICD-10-CM

## 2015-09-16 DIAGNOSIS — R112 Nausea with vomiting, unspecified: Secondary | ICD-10-CM

## 2015-09-16 DIAGNOSIS — R1110 Vomiting, unspecified: Secondary | ICD-10-CM

## 2015-09-16 DIAGNOSIS — E860 Dehydration: Secondary | ICD-10-CM

## 2015-09-16 LAB — DIFFERENTIAL
BASOS PCT: 0 %
Basophils Absolute: 0 10*3/uL (ref 0.0–0.1)
EOS PCT: 4 %
Eosinophils Absolute: 0 10*3/uL (ref 0.0–0.7)
LYMPHS PCT: 89 %
Lymphs Abs: 0.3 10*3/uL — ABNORMAL LOW (ref 0.7–4.0)
MONOS PCT: 0 %
Monocytes Absolute: 0 10*3/uL — ABNORMAL LOW (ref 0.1–1.0)
NEUTROS ABS: 0 10*3/uL — AB (ref 1.7–7.7)
NEUTROS PCT: 7 %

## 2015-09-16 LAB — CBC
HEMATOCRIT: 28.9 % — AB (ref 36.0–46.0)
HEMOGLOBIN: 9.5 g/dL — AB (ref 12.0–15.0)
MCH: 28.6 pg (ref 26.0–34.0)
MCHC: 32.9 g/dL (ref 30.0–36.0)
MCV: 87 fL (ref 78.0–100.0)
Platelets: 116 10*3/uL — ABNORMAL LOW (ref 150–400)
RBC: 3.32 MIL/uL — AB (ref 3.87–5.11)
RDW: 14.3 % (ref 11.5–15.5)
WBC: 0.3 10*3/uL — CL (ref 4.0–10.5)

## 2015-09-16 LAB — BASIC METABOLIC PANEL
Anion gap: 10 (ref 5–15)
BUN: 39 mg/dL — AB (ref 6–20)
CHLORIDE: 112 mmol/L — AB (ref 101–111)
CO2: 17 mmol/L — AB (ref 22–32)
Calcium: 7.8 mg/dL — ABNORMAL LOW (ref 8.9–10.3)
Creatinine, Ser: 2.68 mg/dL — ABNORMAL HIGH (ref 0.44–1.00)
GFR calc Af Amer: 18 mL/min — ABNORMAL LOW (ref >60)
GFR calc non Af Amer: 16 mL/min — ABNORMAL LOW (ref >60)
GLUCOSE: 121 mg/dL — AB (ref 65–99)
POTASSIUM: 4.4 mmol/L (ref 3.5–5.1)
Sodium: 139 mmol/L (ref 135–145)

## 2015-09-16 LAB — C DIFFICILE QUICK SCREEN W PCR REFLEX
C Diff antigen: POSITIVE — AB
C Diff interpretation: POSITIVE
C Diff toxin: POSITIVE — AB

## 2015-09-16 LAB — INFLUENZA PANEL BY PCR (TYPE A & B)
H1N1FLUPCR: NOT DETECTED
INFLBPCR: NEGATIVE
Influenza A By PCR: NEGATIVE

## 2015-09-16 MED ORDER — VANCOMYCIN HCL IN DEXTROSE 1-5 GM/200ML-% IV SOLN
1000.0000 mg | INTRAVENOUS | Status: AC
  Administered 2015-09-16: 1000 mg via INTRAVENOUS
  Filled 2015-09-16: qty 200

## 2015-09-16 MED ORDER — SODIUM CHLORIDE 0.9 % IV SOLN
INTRAVENOUS | Status: DC
  Administered 2015-09-16 – 2015-09-23 (×13): via INTRAVENOUS

## 2015-09-16 MED ORDER — VANCOMYCIN HCL IN DEXTROSE 1-5 GM/200ML-% IV SOLN: 1000.0000 mg | INTRAVENOUS | Status: DC

## 2015-09-16 MED ORDER — ONDANSETRON HCL 4 MG/2ML IJ SOLN
4.0000 mg | Freq: Four times a day (QID) | INTRAMUSCULAR | Status: DC
  Administered 2015-09-16 – 2015-09-23 (×29): 4 mg via INTRAVENOUS
  Filled 2015-09-16 (×29): qty 2

## 2015-09-16 MED ORDER — DEXTROSE 5 % IV SOLN
2.0000 g | INTRAVENOUS | Status: DC
  Administered 2015-09-16 – 2015-09-19 (×4): 2 g via INTRAVENOUS
  Filled 2015-09-16 (×4): qty 2

## 2015-09-16 MED ORDER — DEXTROSE 5 % IV SOLN: 2.0000 g | Freq: Three times a day (TID) | INTRAVENOUS | Status: DC

## 2015-09-16 MED ORDER — PROMETHAZINE HCL 25 MG/ML IJ SOLN
12.5000 mg | Freq: Four times a day (QID) | INTRAMUSCULAR | Status: DC | PRN
  Administered 2015-09-18 – 2015-09-22 (×3): 12.5 mg via INTRAVENOUS
  Filled 2015-09-16 (×3): qty 1

## 2015-09-16 NOTE — Progress Notes (Signed)
IP PROGRESS NOTE  Subjective:  She developed nausea and vomiting beginning yesterday afternoon. She is nausea and again this morning. She also reports a few episodes of diarrhea. She had a low-grade fever this morning.   Objective: Vital signs in last 24 hours: Blood pressure 127/68, pulse 95, temperature 99.1 F (37.3 C), temperature source Oral, resp. rate 24, height 5\' 3"  (1.6 m), weight 145 lb 4.5 oz (65.9 kg), SpO2 96 %.  Intake/Output from previous day: 02/23 0701 - 02/24 0700 In: 250 [P.O.:240; I.V.:10] Out: 604 [Urine:600; Emesis/NG output:1; Stool:3]  Physical Exam:  HEENT: No thrush Lungs: Clear anteriorly Cardiac: Regular rate and rhythm Abdomen: Mildly distended, nontender Extremities: No leg edema   Portacath/PICC-without erythema  Lab Results:  Recent Labs  09/15/15 0526 09/16/15 0420  WBC 0.4* 0.3*  HGB 8.9* 9.5*  HCT 27.1* 28.9*  PLT 121* 116*   ANC-0  BMET  Recent Labs  09/15/15 0526 09/16/15 0420  NA 141 139  K 4.5 4.4  CL 114* 112*  CO2 18* 17*  GLUCOSE 106* 121*  BUN 46* 39*  CREATININE 2.59* 2.68*  CALCIUM 7.9* 7.8*    Studies/Results: No results found.  Medications: I have reviewed the patient's current medications.  Assessment/Plan: 1. Adenocarcinoma involving a duodenal mass  CT 08/27/2015 consistent with a duodenal mass/uncinate mass, liver metastases, pulmonary metastases, and metastatic lymphadenopathy  Cycle 1 FOLFIRI 09/08/2015  2. Anorexia/weight loss  3.Nausea/vomiting-most likely secondary to the duodenal mass,? Obstruction  4. Polycystic kidney disease/renal failure  5. Anemia secondary to renal failure, bleeding from the duodenal tumor, and chemotherapy, status post a red cell transfusion 09/14/2015  6.   Neutropenia/thrombocytopenia secondary to chemotherapy-G-CSF started 09/14/2015  7.   History of diarrhea secondary chemotherapy  8.   Fever-plan to begin broad-spectrum intravenous  antibiotics  Erica Simpson has developed nausea/vomiting over the past 24 hours. This is most likely related to the duodenal tumor. I doubt the nausea is related to chemotherapy at this point. She continues to have severe neutropenia following chemotherapy. She had a low-grade fever this morning.  I discussed the current situation and overall prognosis with her son. I explained she may not be a candidate for further chemotherapy. We will discuss Hospice care pending her clinical status over the next few days.  Recommendations: 1. Continue G-CSF, daily CBC 2. Blood cultures, urine culture, begin broad-spectrum intravenous antibiotics 3. Abdomen x-ray to look for evidence of upper GI obstruction 4. Increase IV fluids well not taking by mouth  I discussed the case with Dr. Eliseo Squires. Please call oncology as needed over the weekend. I will check on her 09/19/2015.  LOS: 3 days   Betsy Coder, MD   09/16/2015, 8:35 AM

## 2015-09-16 NOTE — Care Management Important Message (Signed)
Important Message  Patient Details IM Letter given to Kathy/Case Manager to present to Patient Name: IZZAH ASANTE MRN: [id number] Date of Birth: Sep 16, 1935   Medicare Important Message Given:  Yes    Camillo Flaming 09/16/2015, 1:03 [place] Message  Patient Details  Name: BRANDICE HUGIE MRN: [id number] Date of Birth: 09-22-1935   Medicare Important Message Given:  Yes    Camillo Flaming 09/16/2015, 1:03 PM

## 2015-09-16 NOTE — Progress Notes (Signed)
PT Cancellation Note  Patient Details Name: Erica Simpson MRN: [id number] DOB: 1936/03/12   Cancelled Treatment:    Reason Eval/Treat Not Completed: Other (comment) Pt declines PT again today due to not feeling well.  Will check back as schedule permits.   [name],KATHrine E 09/16/2015, 10:55 AM Carmelia Bake, PT, DPT 09/16/2015 Pager: [phone]

## 2015-09-16 NOTE — Progress Notes (Signed)
PROGRESS NOTE  Erica Simpson [id number] DOB: [birth year] DOA: 09/13/2015 PCP: Gennette Pac, MD  Erica Simpson is a 80 y.o. female with Past medical history of polycystic kidney disease, chronic kidney disease stage III,. 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 Patient was recently started on chemotherapy she had her first cycle completed on 09/08/2015.    Assessment/Plan: Acute-on-chronic kidney injury ([place]) -baseline: 2.5 -encourage PO intake  N/V/diarrhea. Patient has received one cycle of FOLFIRI regimen. This regimen is known to cause diarrhea. R/o c diff ?if need for CT scan vs small bowel to r/o partial obstruction  x-ray of her abdomen ok  essential hypertension and presentation with hypotension. Hold home meds  Adenocarcinoma involving a duodenal mass  CT 08/27/2015 consistent with a duodenal mass/uncinate mass, liver metastases, pulmonary metastases, and metastatic lymphadenopathy  Cycle 1 FOLFIRI 09/08/2015 -Management per oncology. -poor overall prognosis-- ? palliative care eval while in hospital  Pancytopenia- side affect of chemo -started on G-CSF -fever last PM -blood cultures -started IV abx -s/p 2 units PRBC -r/o flu as well- would be an odd presentation   Code Status: full Family Communication: patient/son at bedside Disposition Plan:    Consultants:  Oncology  GI  Procedures:      HPI/Subjective: Unable to keep anything down-- not water or ginger ale Fever last PM as well nursing reports watery diarrhea  Objective: Filed Vitals:   09/16/15 0705 09/16/15 0745  BP:  127/68  Pulse:  95  Temp: 98.4 F (36.9 C) 99.1 F (37.3 C)  Resp:  24    Intake/Output Summary (Last 24 hours) at 09/16/15 1241 Last data filed at 09/16/15 0800  Gross per 24 hour  Intake    130 ml  Output    329 ml  Net   -199 ml   Filed Weights   09/14/15 0518  09/15/15 0501 09/16/15 0700  Weight: 64.955 kg (143 lb 3.2 oz) 66.7 kg (147 lb 0.8 oz) 65.9 kg (145 lb 4.5 oz)    Exam:   General:  Ill appearing  Cardiovascular: rrr  Respiratory: clear  Abdomen: +Bs, soft  Musculoskeletal: no edema  Data Reviewed: Basic Metabolic Panel:  Recent Labs Lab 09/13/15 1430 09/13/15 1830 09/14/15 0645 09/15/15 0526 09/16/15 0420  NA 138 136 142 141 139  K 5.6 Repeated and Verified, no hemolysis* 4.8 5.0 4.5 4.4  CL  --  103 113* 114* 112*  CO2 21* 21* 20* 18* 17*  GLUCOSE 120 131* 98 106* 121*  BUN 69.0* 72* 58* 46* 39*  CREATININE 3.0* 2.92* 2.84* 2.59* 2.68*  CALCIUM 10.2 9.3 8.1* 7.9* 7.8*  MG  --  2.4  --   --   --    Liver Function Tests:  Recent Labs Lab 09/13/15 1430 09/13/15 1830 09/14/15 0645  AST 67* 56* 46*  ALT 68* 56* 43  ALKPHOS 400* 304* 244*  BILITOT 0.66 0.6 0.7  PROT 7.0 6.1* 5.2*  ALBUMIN 2.7* 2.5* 2.2*   No results for input(s): LIPASE, AMYLASE in the last 168 hours. No results for input(s): AMMONIA in the last 168 hours. CBC:  Recent Labs Lab 09/13/15 1430 09/13/15 1830 09/14/15 0645 09/14/15 2356 09/15/15 0526 09/16/15 0420  WBC 3.7* 2.6* 0.8*  --  0.4* 0.3*  NEUTROABS 2.9 1.9 0.2*  --  0.1* 0.0*  HGB 9.0* 7.6* 5.8* 8.7* 8.9* 9.5*  HCT 28.0* 24.1* 19.1* 26.2* 27.1* 28.9*  MCV 83.5 85.5  86.0  --  87.4 87.0  PLT 310 234 136*  --  121* 116*   Cardiac Enzymes: No results for input(s): CKTOTAL, CKMB, CKMBINDEX, TROPONINI in the last 168 hours. BNP (last 3 results) No results for input(s): BNP in the last 8760 hours.  ProBNP (last 3 results) No results for input(s): PROBNP in the last 8760 hours.  CBG: No results for input(s): GLUCAP in the last 168 hours.  No results found for this or any previous visit (from the past 240 hour(s)).   Studies: Dg Abd Portable 1v  09/16/2015  CLINICAL DATA:  Nausea and abdominal pain EXAM: PORTABLE ABDOMEN - 1 VIEW COMPARISON:  CT 09/13/2015 FINDINGS: No  dilated loops of large or small bowel. Gas within the rectum. No organomegaly. No pathologic calcifications. No aggressive osseous lesion IMPRESSION: No acute abdominal findings. Electronically Signed   By: Suzy Bouchard M.D.   On: 09/16/2015 09:00    Scheduled Meds: . ceFEPime (MAXIPIME) IV  2 g Intravenous Q24H  . LORazepam  0.5 mg Oral QHS  . ondansetron (ZOFRAN) IV  4 mg Intravenous 4 times per day  . pantoprazole (PROTONIX) IV  40 mg Intravenous Q12H  . sodium chloride flush  3 mL Intravenous Q12H  . Tbo-Filgrastim  300 mcg Subcutaneous Daily  . [START ON 09/18/2015] vancomycin  1,000 mg Intravenous Q48H   Continuous Infusions: . sodium chloride 75 mL/hr at 09/16/15 0945   Antibiotics Given (last 72 hours)    Date/Time Action Medication Dose Rate   09/16/15 0944 Given   vancomycin (VANCOCIN) IVPB 1000 mg/200 mL premix 1,000 mg 200 mL/hr   09/16/15 1226 Given   ceFEPIme (MAXIPIME) 2 g in dextrose 5 % 50 mL IVPB 2 g 100 mL/hr      Principal Problem:   Acute-on-chronic kidney injury ([place]) Active Problems:   GERD   Small bowel cancer ([facility])   Hypotension   Dehydration   Hyperkalemia   CKD (chronic kidney disease) stage 3, GFR 30-59 ml/min   Diarrhea   Polycystic kidney disease   Chemotherapy-induced neutropenia ([place])    Time spent: 25 min    [facility] Pager [phone] If 7PM-7AM, please contact night-coverage at [email or web address], password [facility] 09/16/2015, 12:41 PM  LOS: 3 days

## 2015-09-16 NOTE — Progress Notes (Signed)
Pharmacy Antibiotic Note  Erica Simpson is a 80 y.o. female admitted on 09/13/2015 with metastatic adenocarcinoma with duodenal mass s/p cycle 1 FOLFIRI 09/08/15, now with chemotherapy induced diarrhea, pancytopenia with severe anemia . Today, ANC = 0.0 and Tm 100.4.  Pharmacy has been consulted for Vancomycin dosing for febrile neutropenia.  Plan: AoCKD (baseline 2.5).  CrCl ~16 CG (N20).   -Cefepime 2g IV q24h  -Vancomycin 1g IV q48h Watch renal fxn closely for dose adjustments F/u ANC, fever, cultures  Height: 5\' 3"  (160 cm) Weight: 145 lb 4.5 oz (65.9 kg) IBW/kg (Calculated) : 52.4  Temp (24hrs), Avg:99.2 F (37.3 C), Min:98.4 F (36.9 C), Max:100.4 F (38 C)   Recent Labs Lab 09/13/15 1430 09/13/15 1815 09/13/15 1830 09/14/15 0645 09/15/15 0526 09/16/15 0420  WBC 3.7*  --  2.6* 0.8* 0.4* 0.3*  CREATININE 3.0*  --  2.92* 2.84* 2.59* 2.68*  LATICACIDVEN  --  2.2*  --   --   --   --     Estimated Creatinine Clearance: 15.8 mL/min (by C-G formula based on Cr of 2.68).    No Known Allergies  Antimicrobials this admission: Cefepime 2/24 >>  Vancomycin 2/24 >>   Dose adjustments this admission: N/A  Microbiology results: 2/24 BCx: ordered  Thank you for allowing pharmacy to be a part of this patient's care.  Ralene Bathe, PharmD, BCPS 09/16/2015, 8:27 AM  Pager: [phone]

## 2015-09-16 NOTE — Progress Notes (Signed)
CRITICAL VALUE ALERT  Critical value received:  Blood cultures positive for gram negative rods  Date of notification:  09/16/2015  Time of notification:  2330  Critical value read back:Yes.    Nurse who received alert:  Joyce Copa, RN  MD notified (1st page):  Donnal Debar  Time of first page:  2335  MD notified (2nd page):  Time of second page:  Responding MD: Donnal Debar  Time MD responded:  2339

## 2015-09-16 NOTE — Consult Note (Signed)
Referring Provider: No ref. provider found Primary Care Physician:  Gennette Pac, MD Primary Gastroenterologist:  Dr.  Luiz Iron for Consultation:  Vomiting; recently diagnosed duodenal cancer  HPI: Erica Simpson is a 80 y.o. female with past medical history of polycystic kidney disease, chronic kidney disease stage III.  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 Fuller Plan for evaluation of anemia earlier this month.  Received first cycle of chemo on 2/16.  Following with Dr. Benay Spice.  Was seen at oncology office because she was not feeling well on 2/21.  Was admitted for hyperkalemia and dehydration.  Also she's had fevers and is neutropenic/pancytopenic.  Received 2 units PRBC's and was started on G-CSF.  Is also on broad-spectrum antibiotics.  GI was called to see her due to vomiting and inability to tolerate even sips.  This started while she has been in the hospital, on Wednesday.  The patient's son was present during my visit and provided most of the information.  Patient kept her eyes closed and was feeling nauseous.  She was able to keep a few sips down this afternoon but vomited when she drank some stuff this AM.  Also having diarrhea that they think is from the chemo.  Stool studies are pending.  She denies abdominal pain (but was tender on exam).  She is on Zofran ATC.  Past Medical History  Diagnosis Date  . Adenomatous colon polyp 02/2001  . Diverticulosis   . Internal hemorrhoids   . Polycystic kidney disease     Stage 4 per patient  . GERD (gastroesophageal reflux disease)   . Hypertension   . Lichen planus     Past Surgical History  Procedure Laterality Date  . Oophorectomy    . Appendectomy    . Cholecystectomy    . Partial hysterectomy      Prior to Admission medications   Medication Sig Start Date End Date Taking? Authorizing Provider  alendronate (FOSAMAX) 70 MG tablet Take 70 mg by mouth once a week. Take with a full  glass of water on an empty stomach.   Yes Historical Provider, MD  amLODipine (NORVASC) 5 MG tablet Take 5 mg by mouth daily.   Yes Historical Provider, MD  bisacodyl (DULCOLAX) 5 MG EC tablet Take 5 mg by mouth as needed for moderate constipation.   Yes Historical Provider, MD  HYDROcodone-acetaminophen (NORCO/VICODIN) 5-325 MG tablet Take 1 tablet by mouth every 4 (four) hours as needed for moderate pain. 09/06/15  Yes Ladell Pier, MD  lidocaine-prilocaine (EMLA) cream Apply small amount over port area 1 hour prior to treatment and cover with plastic wrap.  DO NOT RUB IN 09/06/15  Yes Ladell Pier, MD  loratadine (CLARITIN) 10 MG tablet Take 10 mg by mouth daily.   Yes Historical Provider, MD  LORazepam (ATIVAN) 0.5 MG tablet Take 1 tablet (0.5 mg total) by mouth at bedtime. 09/06/15  Yes Ladell Pier, MD  omeprazole (PRILOSEC) 40 MG capsule Take 40 mg by mouth daily as needed (acid reflux).  08/05/15  Yes Historical Provider, MD  ondansetron (ZOFRAN) 4 MG tablet Take 1 tablet (4 mg total) by mouth every 6 (six) hours. 08/27/15  Yes Jeffrey Hedges, PA-C  traMADol (ULTRAM) 50 MG tablet Take 1 tablet (50 mg total) by mouth 3 (three) times daily as needed for moderate pain. 08/29/15  Yes Ladene Artist, MD    Current Facility-Administered Medications  Medication Dose Route Frequency Provider  Last Rate Last Dose  . 0.9 %  sodium chloride infusion   Intravenous Continuous Ladell Pier, MD 75 mL/hr at 09/16/15 0945    . acetaminophen (TYLENOL) tablet 650 mg  650 mg Oral Q6H PRN Lavina Hamman, MD   650 mg at 09/16/15 0617   Or  . acetaminophen (TYLENOL) suppository 650 mg  650 mg Rectal Q6H PRN Lavina Hamman, MD      . alum & mag hydroxide-simeth (MAALOX/MYLANTA) 200-200-20 MG/5ML suspension 15 mL  15 mL Oral Q4H PRN Geradine Girt, DO      . ceFEPIme (MAXIPIME) 2 g in dextrose 5 % 50 mL IVPB  2 g Intravenous Q24H Geradine Girt, DO   2 g at 09/16/15 1226  . HYDROcodone-acetaminophen  (NORCO/VICODIN) 5-325 MG per tablet 1 tablet  1 tablet Oral Q4H PRN Lavina Hamman, MD      . LORazepam (ATIVAN) tablet 0.5 mg  0.5 mg Oral QHS Lavina Hamman, MD   0.5 mg at 09/15/15 2146  . morphine 2 MG/ML injection 2 mg  2 mg Intravenous Q4H PRN Lavina Hamman, MD      . ondansetron [facility]) injection 4 mg  4 mg Intravenous 4 times per day Geradine Girt, DO   4 mg at 09/16/15 1233  . pantoprazole (PROTONIX) injection 40 mg  40 mg Intravenous Q12H Lavina Hamman, MD   40 mg at 09/16/15 1055  . promethazine (PHENERGAN) injection 12.5 mg  12.5 mg Intravenous Q6H PRN Geradine Girt, DO      . sodium chloride flush (NS) 0.9 % injection 10-40 mL  10-40 mL Intracatheter PRN Ladell Pier, MD   10 mL at 09/16/15 0420  . sodium chloride flush (NS) 0.9 % injection 3 mL  3 mL Intravenous Q12H Lavina Hamman, MD   3 mL at 09/16/15 1056  . Tbo-Filgrastim (GRANIX) injection 300 mcg  300 mcg Subcutaneous Daily Ladell Pier, MD   300 mcg at 09/16/15 1055  . traMADol (ULTRAM) tablet 50 mg  50 mg Oral TID PRN Lavina Hamman, MD      . Derrill Memo ON 09/18/2015] vancomycin (VANCOCIN) IVPB 1000 mg/200 mL premix  1,000 mg Intravenous Q48H Geradine Girt, DO        Allergies as of 09/13/2015  . (No Known Allergies)    Family History  Problem Relation Age of Onset  . Breast cancer Sister   . Heart disease Mother   . Kidney disease Mother   . Colon cancer Neg Hx   . Kidney disease Father   . Heart disease Father     Social History   Social History  . Marital Status: Widowed    Spouse Name: N/A  . Number of Children: 1  . Years of Education: N/A   Occupational History  . Retired    Social History Main Topics  . Smoking status: Never Smoker   . Smokeless tobacco: Never Used  . Alcohol Use: Yes     Comment: occ.  . Drug Use: No  . Sexual Activity: Not on file   Other Topics Concern  . Not on file   Social History Narrative   Caffeine 3 cups of coffee a day   Widowed, lives alone   Has  one son-locally   Originally from [place]-came to [place] in 1987   Retired from office work   Has cats    Review of Systems: Ten point ROS is O/W  negative except as mentioned in HPI.  Physical Exam: Vital signs in last 24 hours: Temp:  [98.4 F (36.9 C)-100.4 F (38 C)] 99.1 F (37.3 C) (02/24 0745) Pulse Rate:  [95-103] 95 (02/24 0745) Resp:  [18-24] 24 (02/24 0745) BP: (127-150)/(60-76) 127/68 mmHg (02/24 0745) SpO2:  [96 %-100 %] 96 % (02/24 0745) Weight:  [145 lb 4.5 oz (65.9 kg)] 145 lb 4.5 oz (65.9 kg) (02/24 0700) Last BM Date: 09/16/15 General:  Alert, cooperative but tired and does not feel well. Head:  Normocephalic and atraumatic. Eyes:  Sclera clear, no icterus.  Conjunctiva pink. Ears:  Normal auditory acuity. Mouth:  No deformity or lesions.   Lungs:  Clear throughout to auscultation anteriorly. Heart:  Regular rate and rhythm; no murmurs, clicks, rubs,  or gallops. Abdomen:  Soft, maybe mildly distended.  BS present.  Left sided TTP. Rectal:  Deferred  Msk:  Symmetrical without gross deformities. Pulses:  Normal pulses noted. Extremities:  Without clubbing or edema. Neurologic:  Alert and oriented x 4;  grossly normal neurologically. Skin:  Intact without significant lesions or rashes. Psych:  Alert and cooperative. Normal mood and affect.  Intake/Output from previous day: 02/23 0701 - 02/24 0700 In: 250 [P.O.:240; I.V.:10] Out: 604 [Urine:600; Emesis/NG output:1; Stool:3] Intake/Output this shift: Total I/O In: -  Out: 75 [Urine:75]  Lab Results:  Recent Labs  09/14/15 0645 09/14/15 2356 09/15/15 0526 09/16/15 0420  WBC 0.8*  --  0.4* 0.3*  HGB 5.8* 8.7* 8.9* 9.5*  HCT 19.1* 26.2* 27.1* 28.9*  PLT 136*  --  121* 116*   BMET  Recent Labs  09/14/15 0645 09/15/15 0526 09/16/15 0420  NA 142 141 139  K 5.0 4.5 4.4  CL 113* 114* 112*  CO2 20* 18* 17*  GLUCOSE 98 106* 121*  BUN 58* 46* 39*  CREATININE 2.84* 2.59* 2.68*  CALCIUM 8.1* 7.9* 7.8*     LFT  Recent Labs  09/14/15 0645  PROT 5.2*  ALBUMIN 2.2*  AST 46*  ALT 43  ALKPHOS 244*  BILITOT 0.7   PT/INR  Recent Labs  09/13/15 1830  LABPROT 14.7  INR 1.18   Studies/Results: Dg Abd Portable 1v  09/16/2015  CLINICAL DATA:  Nausea and abdominal pain EXAM: PORTABLE ABDOMEN - 1 VIEW COMPARISON:  CT 09/13/2015 FINDINGS: No dilated loops of large or small bowel. Gas within the rectum. No organomegaly. No pathologic calcifications. No aggressive osseous lesion IMPRESSION: No acute abdominal findings. Electronically Signed   By: Suzy Bouchard M.D.   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  ? More likely small bowel.  S/p one cycle of chemo on 2/16.  Now admitted with hyperkalemia, dehydration, and neutropenic fever.  While she is here she has developed nausea and vomiting, much worse than her baseline; unable to even tolerate many sips.  They are questioning partial SBO from malignancy.  Apparently they discussed and considered a stent previously.  Now obviously her WBC count is too low to do invasive procedure and most likely we would need either CT scan or SBFT to delineate or define obstruction before intervention.  She cannot tolerate drinking any type of barium or contrast at this time.  Also, could this be a viral gastroenteritis in the setting of neutropenia since she's had some diarrhea as well?  Stool studies pending.  Her diarrhea is suspected to possibly be chemo-induced.  On broad-spectrum  antibiotics.  She is on Zofran 4 mg ATC.   -Pancytopenia:  Chemo induced.  Received 2 units PRBC's.  Started on G-CSF on 2/22. -CKD/polycystic kidney disease  PLAN: -Await stool studies.  She is on empiric broad-spectrum antibiotics.   -Monitor cell counts. -Continue anti-emetics. -Will discuss with Dr. Havery Moros.  ZEHR, JESSICA D.  09/16/2015, 12:54 PM  Pager number  [phone]

## 2015-09-17 ENCOUNTER — Encounter (HOSPITAL_COMMUNITY): Payer: Self-pay | Admitting: Internal Medicine

## 2015-09-17 DIAGNOSIS — R7881 Bacteremia: Secondary | ICD-10-CM

## 2015-09-17 DIAGNOSIS — A047 Enterocolitis due to Clostridium difficile: Secondary | ICD-10-CM

## 2015-09-17 DIAGNOSIS — A0472 Enterocolitis due to Clostridium difficile, not specified as recurrent: Secondary | ICD-10-CM | POA: Diagnosis present

## 2015-09-17 LAB — CBC WITH DIFFERENTIAL/PLATELET
BASOS ABS: 0 10*3/uL (ref 0.0–0.1)
Basophils Relative: 0 %
EOS ABS: 0 10*3/uL (ref 0.0–0.7)
Eosinophils Relative: 0 %
HCT: 26.2 % — ABNORMAL LOW (ref 36.0–46.0)
HEMOGLOBIN: 8.6 g/dL — AB (ref 12.0–15.0)
LYMPHS PCT: 91 %
Lymphs Abs: 0.3 10*3/uL — ABNORMAL LOW (ref 0.7–4.0)
MCH: 28.5 pg (ref 26.0–34.0)
MCHC: 32.8 g/dL (ref 30.0–36.0)
MCV: 86.8 fL (ref 78.0–100.0)
MONOS PCT: 6 %
Monocytes Absolute: 0 10*3/uL — ABNORMAL LOW (ref 0.1–1.0)
NEUTROS PCT: 3 %
Neutro Abs: 0 10*3/uL — ABNORMAL LOW (ref 1.7–7.7)
PLATELETS: 108 10*3/uL — AB (ref 150–400)
RBC: 3.02 MIL/uL — ABNORMAL LOW (ref 3.87–5.11)
RDW: 14.7 % (ref 11.5–15.5)
WBC: 0.3 10*3/uL — CL (ref 4.0–10.5)

## 2015-09-17 LAB — URINE CULTURE

## 2015-09-17 MED ORDER — VANCOMYCIN 50 MG/ML ORAL SOLUTION
500.0000 mg | Freq: Four times a day (QID) | ORAL | Status: DC
  Administered 2015-09-17 – 2015-09-23 (×25): 500 mg via ORAL
  Filled 2015-09-17 (×31): qty 10

## 2015-09-17 MED ORDER — POTASSIUM CHLORIDE CRYS ER 20 MEQ PO TBCR: 40.0000 meq | EXTENDED_RELEASE_TABLET | Freq: Once | ORAL | Status: DC

## 2015-09-17 MED ORDER — METRONIDAZOLE IN NACL 5-0.79 MG/ML-% IV SOLN
500.0000 mg | Freq: Three times a day (TID) | INTRAVENOUS | Status: DC
  Administered 2015-09-17 – 2015-09-22 (×15): 500 mg via INTRAVENOUS
  Filled 2015-09-17 (×16): qty 100

## 2015-09-17 MED ORDER — VITAMINS A & D EX OINT
TOPICAL_OINTMENT | CUTANEOUS | Status: AC
  Administered 2015-09-17: 16:00:00
  Filled 2015-09-17: qty 5

## 2015-09-17 NOTE — Progress Notes (Signed)
      Progress Note   Subjective  Patient was diagnosed with C diff. She has started therapy, she thinks she feels a bit better but continues to have severe nausea. No abdominal pain   Objective   Vital signs in last 24 hours: Temp:  [98.2 F (36.8 C)-98.7 F (37.1 C)] 98.7 F (37.1 C) (02/25 0547) Pulse Rate:  [93-101] 93 (02/25 0547) Resp:  [20] 20 (02/25 0547) BP: (109-113)/(60-67) 109/60 mmHg (02/25 0547) SpO2:  [96 %-98 %] 98 % (02/25 0547) Weight:  [145 lb 8.1 oz (66 kg)] 145 lb 8.1 oz (66 kg) (02/25 0547) Last BM Date: 09/17/15 General:    white female in NAD, resting in bed Heart:  Regular rate and rhythm; no murmurs Lungs: Respirations even and unlabored, lungs CTA bilaterally Abdomen:  Soft, nontender and nondistended. Normal bowel sounds. Neurologic:  Alert and oriented,  grossly normal neurologically. Psych:  Cooperative. Normal mood and affect.  Intake/Output from previous day: 02/24 0701 - 02/25 0700 In: 1943.8 [P.O.:300; I.V.:1593.8; IV Piggyback:50] Out: 75 [Urine:75] Intake/Output this shift: Total I/O In: -  Out: 300 [Urine:300]  Lab Results:  Recent Labs  09/15/15 0526 09/16/15 0420 09/17/15 0348  WBC 0.4* 0.3* 0.3*  HGB 8.9* 9.5* 8.6*  HCT 27.1* 28.9* 26.2*  PLT 121* 116* 108*   BMET  Recent Labs  09/15/15 0526 09/16/15 0420  NA 141 139  K 4.5 4.4  CL 114* 112*  CO2 18* 17*  GLUCOSE 106* 121*  BUN 46* 39*  CREATININE 2.59* 2.68*  CALCIUM 7.9* 7.8*   LFT No results for input(s): PROT, ALBUMIN, AST, ALT, ALKPHOS, BILITOT, BILIDIR, IBILI in the last 72 hours. PT/INR No results for input(s): LABPROT, INR in the last 72 hours.  Studies/Results: Dg Abd Portable 1v  09/16/2015  CLINICAL DATA:  Nausea and abdominal pain EXAM: PORTABLE ABDOMEN - 1 VIEW COMPARISON:  CT 09/13/2015 FINDINGS: No dilated loops of large or small bowel. Gas within the rectum. No organomegaly. No pathologic calcifications. No aggressive osseous lesion  IMPRESSION: No acute abdominal findings. Electronically Signed   By: Suzy Bouchard M.D.   On: 09/16/2015 09:00       Assessment / Plan:   80 y/o female with recently diagnosed duodenal carcinoma, s/p first cycle of chemotherapy, with neutropenia and fevers. She was admitted with diarrhea and severe nausea / vomiting. She has tested positive for C Diff overnight which could be driving most of her symptoms. Regarding her nausea and vomiting, it is also possible she is developing gastric outlet obstruction from the malignancy, but initial xray did not show any obstruction.   For now, I agree with aggressive treatment of C diff with IV flagyl and oral vancomycin given her neutropenia. I hope if she responds to this perhaps it will improve her nausea and vomiting. If not, I would recommend some type of oral contrast imaging, CT, if she can tolerate it to rule out gastric outlet obstruction. Unfortunately with her severe neutropenia she is not a good candidate for endoscopy / stenting right now if she were to be obstructed. She otherwise wishes to speak with Oncology, reports she does not think she can tolerate any further attempts with chemotherapy and she may consider palliative care consult.   We will follow peripherally for now, please call with additional questions.  Jan [place] Cellar, MD [facility] Pager [phone]      LOS: 4 days   Renelda Loma Promise Weldin  09/17/2015, 2:05 PM

## 2015-09-17 NOTE — Progress Notes (Signed)
PROGRESS NOTE  Erica Simpson [id number] DOB: [birth year] DOA: 09/13/2015 PCP: Gennette Pac, MD  Erica Simpson is a 80 y.o. female with Past medical history of polycystic kidney disease, chronic kidney disease stage III,. 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 Patient was recently started on chemotherapy she had her first cycle completed on 09/08/2015.    Assessment/Plan: Acute-on-chronic kidney injury ([place]) -baseline: 2.5 -encourage PO intake  N/V/diarrhea due to C diff colitis -IV flagyl and PO vanc (not sure if patient will be able to take in PO with vomiting) ?if need for CT scan vs small bowel to r/o partial obstruction  x-ray of her abdomen ok  essential hypertension and presentation with hypotension. Hold home meds  Adenocarcinoma involving a duodenal mass  CT 08/27/2015 consistent with a duodenal mass/uncinate mass, liver metastases, pulmonary metastases, and metastatic lymphadenopathy  Cycle 1 FOLFIRI 09/08/2015 -Management per oncology. -poor overall prognosis-- ? palliative care eval while in hospital  Pancytopenia- side affect of chemo -started on G-CSF -blood cultures- gram neg rods -started IV abx- will narrow to maxipime as gram - rods  -s/p 2 units PRBC -flu negative  Gram neg bacteremia maxipime   Low threshold for SDU transfer   Code Status: full Family Communication: patient/son at bedside Disposition Plan:    Consultants:  Oncology  GI  Procedures:      HPI/Subjective: Still with nausea  Objective: Filed Vitals:   09/16/15 2207 09/17/15 0547  BP: 113/67 109/60  Pulse: 101 93  Temp: 98.2 F (36.8 C) 98.7 F (37.1 C)  Resp: 20 20    Intake/Output Summary (Last 24 hours) at 09/17/15 1313 Last data filed at 09/17/15 1208  Gross per 24 hour  Intake 1883.75 ml  Output    300 ml  Net 1583.75 ml   Filed Weights   09/15/15 0501  09/16/15 0700 09/17/15 0547  Weight: 66.7 kg (147 lb 0.8 oz) 65.9 kg (145 lb 4.5 oz) 66 kg (145 lb 8.1 oz)    Exam:   General:  Ill appearing  Cardiovascular: rrr  Respiratory: clear  Abdomen: +Bs, soft  Musculoskeletal: no edema  Data Reviewed: Basic Metabolic Panel:  Recent Labs Lab 09/13/15 1430 09/13/15 1830 09/14/15 0645 09/15/15 0526 09/16/15 0420  NA 138 136 142 141 139  K 5.6 Repeated and Verified, no hemolysis* 4.8 5.0 4.5 4.4  CL  --  103 113* 114* 112*  CO2 21* 21* 20* 18* 17*  GLUCOSE 120 131* 98 106* 121*  BUN 69.0* 72* 58* 46* 39*  CREATININE 3.0* 2.92* 2.84* 2.59* 2.68*  CALCIUM 10.2 9.3 8.1* 7.9* 7.8*  MG  --  2.4  --   --   --    Liver Function Tests:  Recent Labs Lab 09/13/15 1430 09/13/15 1830 09/14/15 0645  AST 67* 56* 46*  ALT 68* 56* 43  ALKPHOS 400* 304* 244*  BILITOT 0.66 0.6 0.7  PROT 7.0 6.1* 5.2*  ALBUMIN 2.7* 2.5* 2.2*   No results for input(s): LIPASE, AMYLASE in the last 168 hours. No results for input(s): AMMONIA in the last 168 hours. CBC:  Recent Labs Lab 09/13/15 1830 09/14/15 0645 09/14/15 2356 09/15/15 0526 09/16/15 0420 09/17/15 0348  WBC 2.6* 0.8*  --  0.4* 0.3* 0.3*  NEUTROABS 1.9 0.2*  --  0.1* 0.0* 0.0*  HGB 7.6* 5.8* 8.7* 8.9* 9.5* 8.6*  HCT 24.1* 19.1* 26.2* 27.1* 28.9* 26.2*  MCV 85.5 86.0  --  87.4 87.0 86.8  PLT 234 136*  --  121* 116* 108*   Cardiac Enzymes: No results for input(s): CKTOTAL, CKMB, CKMBINDEX, TROPONINI in the last 168 hours. BNP (last 3 results) No results for input(s): BNP in the last 8760 hours.  ProBNP (last 3 results) No results for input(s): PROBNP in the last 8760 hours.  CBG: No results for input(s): GLUCAP in the last 168 hours.  Recent Results (from the past 240 hour(s))  Culture, blood (Routine X 2) w Reflex to ID Panel     Status: None (Preliminary result)   Collection Time: 09/16/15  8:48 AM  Result Value Ref Range Status   Specimen Description BLOOD BLOOD  RIGHT WRIST  Final   Special Requests BOTTLES DRAWN AEROBIC AND ANAEROBIC 10CC  Final   Culture  Setup Time   Final    GRAM NEGATIVE RODS IN BOTH AEROBIC AND ANAEROBIC BOTTLES CRITICAL RESULT CALLED TO, READ BACK BY AND VERIFIED WITHHewitt Shorts RN 2327 09/16/15 A BROWNING    Culture   Final    GRAM NEGATIVE RODS CULTURE REINCUBATED FOR BETTER GROWTH Performed at [facility]    Report Status PENDING  Incomplete  Culture, blood (Routine X 2) w Reflex to ID Panel     Status: None (Preliminary result)   Collection Time: 09/16/15  8:48 AM  Result Value Ref Range Status   Specimen Description BLOOD RIGHT ARM  Final   Special Requests BOTTLES DRAWN AEROBIC AND ANAEROBIC 10CC  Final   Culture  Setup Time   Final    GRAM NEGATIVE RODS IN BOTH AEROBIC AND ANAEROBIC BOTTLES CRITICAL RESULT CALLED TO, READ BACK BY AND VERIFIED WITHHewitt Shorts RN 2327 09/16/15 A BROWNING    Culture   Final    GRAM NEGATIVE RODS CULTURE REINCUBATED FOR BETTER GROWTH Performed at [facility]    Report Status PENDING  Incomplete  C difficile quick scan w PCR reflex     Status: Abnormal   Collection Time: 09/16/15  5:48 PM  Result Value Ref Range Status   C Diff antigen POSITIVE (A) NEGATIVE Final   C Diff toxin POSITIVE (A) NEGATIVE Final   C Diff interpretation Positive for toxigenic C. difficile  Final    Comment: CRITICAL RESULT CALLED TO, READ BACK BY AND VERIFIED WITH: Britta Mccreedy [id number] @ 1902 BY J SCOTTON      Studies: Dg Abd Portable 1v  09/16/2015  CLINICAL DATA:  Nausea and abdominal pain EXAM: PORTABLE ABDOMEN - 1 VIEW COMPARISON:  CT 09/13/2015 FINDINGS: No dilated loops of large or small bowel. Gas within the rectum. No organomegaly. No pathologic calcifications. No aggressive osseous lesion IMPRESSION: No acute abdominal findings. Electronically Signed   By: Suzy Bouchard M.D.   On: 09/16/2015 09:00    Scheduled Meds: . ceFEPime (MAXIPIME) IV  2 g Intravenous Q24H  .  LORazepam  0.5 mg Oral QHS  . metronidazole  500 mg Intravenous Q8H  . ondansetron (ZOFRAN) IV  4 mg Intravenous 4 times per day  . pantoprazole (PROTONIX) IV  40 mg Intravenous Q12H  . sodium chloride flush  3 mL Intravenous Q12H  . Tbo-Filgrastim  300 mcg Subcutaneous Daily  . vancomycin  500 mg Oral 4 times per day   Continuous Infusions: . sodium chloride 75 mL/hr at 09/16/15 2239   Antibiotics Given (last 72 hours)    Date/Time Action Medication Dose Rate   09/16/15 0944 Given   vancomycin (VANCOCIN) IVPB 1000 mg/200  mL premix 1,000 mg 200 mL/hr   09/16/15 1226 Given   ceFEPIme (MAXIPIME) 2 g in dextrose 5 % 50 mL IVPB 2 g 100 mL/hr   09/17/15 0827 Given   ceFEPIme (MAXIPIME) 2 g in dextrose 5 % 50 mL IVPB 2 g 100 mL/hr   09/17/15 1030 Given   metroNIDAZOLE (FLAGYL) IVPB 500 mg 500 mg 100 mL/hr   09/17/15 1216 Given   vancomycin (VANCOCIN) 50 mg/mL oral solution 500 mg 500 mg       Principal Problem:   Acute-on-chronic kidney injury ([place]) Active Problems:   GERD   Small bowel cancer ([facility])   Hypotension   Dehydration   Hyperkalemia   CKD (chronic kidney disease) stage 3, GFR 30-59 ml/min   Diarrhea   Polycystic kidney disease   Chemotherapy-induced neutropenia ([facility])   Intractable nausea and vomiting   Fever, unspecified   Vomiting    Time spent: 35 min    [place] [facility] Pager [phone] If 7PM-7AM, please contact night-coverage at [email or web address], password [email or web address] 09/17/2015, 1:13 PM  LOS: 4 days

## 2015-09-17 NOTE — Progress Notes (Signed)
PT Cancellation Note / Sign off  Patient Details Name: Erica Simpson MRN: [id number] DOB: Jul 04, 1936   Cancelled Treatment:    Reason Eval/Treat Not Completed: Medical issues which prohibited therapy Pt positive for c-diff per RN and having diarrhea this morning.  This is 3rd cancel for pt.  Please re-order when pt is ready to participate.   Erica Simpson,Erica Simpson 09/17/2015, 9:06 AM Carmelia Bake, PT, DPT 09/17/2015 Pager: [phone]

## 2015-09-18 DIAGNOSIS — R7881 Bacteremia: Secondary | ICD-10-CM

## 2015-09-18 DIAGNOSIS — B9620 Unspecified Escherichia coli [E. coli] as the cause of diseases classified elsewhere: Secondary | ICD-10-CM

## 2015-09-18 LAB — GASTROINTESTINAL PANEL BY PCR, STOOL (REPLACES STOOL CULTURE)
Adenovirus F40/41: NOT DETECTED
Astrovirus: NOT DETECTED
CRYPTOSPORIDIUM: NOT DETECTED
CYCLOSPORA CAYETANENSIS: NOT DETECTED
Campylobacter species: NOT DETECTED
E. COLI O157: NOT DETECTED
ENTAMOEBA HISTOLYTICA: NOT DETECTED
Enteroaggregative E coli (EAEC): NOT DETECTED
Enteropathogenic E coli (EPEC): NOT DETECTED
Enterotoxigenic E coli (ETEC): NOT DETECTED
Giardia lamblia: NOT DETECTED
Norovirus GI/GII: NOT DETECTED
Plesimonas shigelloides: NOT DETECTED
ROTAVIRUS A: NOT DETECTED
SALMONELLA SPECIES: NOT DETECTED
SAPOVIRUS (I, II, IV, AND V): NOT DETECTED
SHIGELLA/ENTEROINVASIVE E COLI (EIEC): NOT DETECTED
Shiga like toxin producing E coli (STEC): NOT DETECTED
VIBRIO CHOLERAE: NOT DETECTED
VIBRIO SPECIES: NOT DETECTED
YERSINIA ENTEROCOLITICA: NOT DETECTED

## 2015-09-18 LAB — CBC WITH DIFFERENTIAL/PLATELET
Basophils Absolute: 0 10*3/uL (ref 0.0–0.1)
Basophils Relative: 0 %
EOS PCT: 5 %
Eosinophils Absolute: 0 10*3/uL (ref 0.0–0.7)
HEMATOCRIT: 23.9 % — AB (ref 36.0–46.0)
HEMOGLOBIN: 7.8 g/dL — AB (ref 12.0–15.0)
LYMPHS ABS: 0.3 10*3/uL — AB (ref 0.7–4.0)
LYMPHS PCT: 86 %
MCH: 28.4 pg (ref 26.0–34.0)
MCHC: 32.6 g/dL (ref 30.0–36.0)
MCV: 86.9 fL (ref 78.0–100.0)
MONOS PCT: 7 %
Monocytes Absolute: 0 10*3/uL — ABNORMAL LOW (ref 0.1–1.0)
NEUTROS ABS: 0 10*3/uL — AB (ref 1.7–7.7)
Neutrophils Relative %: 2 %
Platelets: 84 10*3/uL — ABNORMAL LOW (ref 150–400)
RBC: 2.75 MIL/uL — ABNORMAL LOW (ref 3.87–5.11)
RDW: 14.9 % (ref 11.5–15.5)
WBC: 0.3 10*3/uL — CL (ref 4.0–10.5)

## 2015-09-18 LAB — CULTURE, BLOOD (ROUTINE X 2)

## 2015-09-18 LAB — BASIC METABOLIC PANEL
ANION GAP: 8 (ref 5–15)
BUN: 46 mg/dL — ABNORMAL HIGH (ref 6–20)
CALCIUM: 7.4 mg/dL — AB (ref 8.9–10.3)
CHLORIDE: 117 mmol/L — AB (ref 101–111)
CO2: 14 mmol/L — AB (ref 22–32)
CREATININE: 3.13 mg/dL — AB (ref 0.44–1.00)
GFR calc non Af Amer: 13 mL/min — ABNORMAL LOW (ref >60)
GFR, EST AFRICAN AMERICAN: 15 mL/min — AB (ref >60)
GLUCOSE: 97 mg/dL (ref 65–99)
Potassium: 4 mmol/L (ref 3.5–5.1)
Sodium: 139 mmol/L (ref 135–145)

## 2015-09-18 NOTE — Progress Notes (Signed)
PROGRESS NOTE  KYMM SOCK [id number] DOB: 08-31-1935 DOA: 09/13/2015 PCP: Gennette Pac, MD  Erica Simpson is a 80 y.o. female with Past medical history of polycystic kidney disease, chronic kidney disease stage III,. 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 Patient was recently started on chemotherapy she had her first cycle completed on 09/08/2015.    Assessment/Plan: Acute-on-chronic kidney injury ([place]) -baseline: 2.5 -increase IVF  N/V/diarrhea due to C diff colitis -IV flagyl and PO vanc (not sure if patient will be able to take in PO with vomiting) ?if need for CT scan vs small bowel to r/o partial obstruction  x-ray of her abdomen ok  essential hypertension and presentation with hypotension. Hold home meds  Adenocarcinoma involving a duodenal mass  CT 08/27/2015 consistent with a duodenal mass/uncinate mass, liver metastases, pulmonary metastases, and metastatic lymphadenopathy  Cycle 1 FOLFIRI 09/08/2015 -Management per oncology. -poor overall prognosis-- ? palliative care eval while in hospital  Pancytopenia- side affect of chemo -started on G-CSF -blood cultures- gram neg rods -started IV abx- will narrow to maxipime as gram - rods  -s/p 2 units PRBC -flu negative  ecoli bacteremia maxipime -repeat BC     Code Status: full Family Communication: patient/son at bedside Disposition Plan:    Consultants:  Oncology  GI  Procedures:      HPI/Subjective: Nausea today- vomited x 1  Objective: Filed Vitals:   09/17/15 2124 09/18/15 0526  BP: 123/63 116/66  Pulse: 93 94  Temp: 98.4 F (36.9 C) 98.5 F (36.9 C)  Resp: 20 20    Intake/Output Summary (Last 24 hours) at 09/18/15 1300 Last data filed at 09/18/15 1011  Gross per 24 hour  Intake 2506.25 ml  Output    200 ml  Net 2306.25 ml   Filed Weights   09/16/15 0700 09/17/15 0547 09/18/15 0526   Weight: 65.9 kg (145 lb 4.5 oz) 66 kg (145 lb 8.1 oz) 70.4 kg (155 lb 3.3 oz)    Exam:   General:  Ill appearing  Cardiovascular: rrr  Respiratory: clear  Abdomen: +Bs, soft  Musculoskeletal: no edema  Data Reviewed: Basic Metabolic Panel:  Recent Labs Lab 09/13/15 1830 09/14/15 0645 09/15/15 0526 09/16/15 0420 09/18/15 0436  NA 136 142 141 139 139  K 4.8 5.0 4.5 4.4 4.0  CL 103 113* 114* 112* 117*  CO2 21* 20* 18* 17* 14*  GLUCOSE 131* 98 106* 121* 97  BUN 72* 58* 46* 39* 46*  CREATININE 2.92* 2.84* 2.59* 2.68* 3.13*  CALCIUM 9.3 8.1* 7.9* 7.8* 7.4*  MG 2.4  --   --   --   --    Liver Function Tests:  Recent Labs Lab 09/13/15 1430 09/13/15 1830 09/14/15 0645  AST 67* 56* 46*  ALT 68* 56* 43  ALKPHOS 400* 304* 244*  BILITOT 0.66 0.6 0.7  PROT 7.0 6.1* 5.2*  ALBUMIN 2.7* 2.5* 2.2*   No results for input(s): LIPASE, AMYLASE in the last 168 hours. No results for input(s): AMMONIA in the last 168 hours. CBC:  Recent Labs Lab 09/14/15 0645 09/14/15 2356 09/15/15 0526 09/16/15 0420 09/17/15 0348 09/18/15 0436  WBC 0.8*  --  0.4* 0.3* 0.3* 0.3*  NEUTROABS 0.2*  --  0.1* 0.0* 0.0* 0.0*  HGB 5.8* 8.7* 8.9* 9.5* 8.6* 7.8*  HCT 19.1* 26.2* 27.1* 28.9* 26.2* 23.9*  MCV 86.0  --  87.4 87.0 86.8 86.9  PLT 136*  --  121* 116* 108* 84*   Cardiac Enzymes: No results for input(s): CKTOTAL, CKMB, CKMBINDEX, TROPONINI in the last 168 hours. BNP (last 3 results) No results for input(s): BNP in the last 8760 hours.  ProBNP (last 3 results) No results for input(s): PROBNP in the last 8760 hours.  CBG: No results for input(s): GLUCAP in the last 168 hours.  Recent Results (from the past 240 hour(s))  Culture, blood (Routine X 2) w Reflex to ID Panel     Status: None   Collection Time: 09/16/15  8:48 AM  Result Value Ref Range Status   Specimen Description BLOOD BLOOD RIGHT WRIST  Final   Special Requests BOTTLES DRAWN AEROBIC AND ANAEROBIC 10CC  Final    Culture  Setup Time   Final    GRAM NEGATIVE RODS IN BOTH AEROBIC AND ANAEROBIC BOTTLES CRITICAL RESULT CALLED TO, READ BACK BY AND VERIFIED WITHHewitt Shorts RN 2327 09/16/15 A BROWNING    Culture   Final    ESCHERICHIA COLI Performed at [facility]    Report Status 09/18/2015 FINAL  Final   Organism ID, Bacteria ESCHERICHIA COLI  Final      Susceptibility   Escherichia coli - MIC*    AMPICILLIN <=2 SENSITIVE Sensitive     CEFAZOLIN <=4 SENSITIVE Sensitive     CEFEPIME <=1 SENSITIVE Sensitive     CEFTAZIDIME <=1 SENSITIVE Sensitive     CEFTRIAXONE <=1 SENSITIVE Sensitive     CIPROFLOXACIN <=0.25 SENSITIVE Sensitive     GENTAMICIN <=1 SENSITIVE Sensitive     IMIPENEM <=0.25 SENSITIVE Sensitive     TRIMETH/SULFA <=20 SENSITIVE Sensitive     AMPICILLIN/SULBACTAM <=2 SENSITIVE Sensitive     PIP/TAZO <=4 SENSITIVE Sensitive     * ESCHERICHIA COLI  Culture, blood (Routine X 2) w Reflex to ID Panel     Status: None   Collection Time: 09/16/15  8:48 AM  Result Value Ref Range Status   Specimen Description BLOOD RIGHT ARM  Final   Special Requests BOTTLES DRAWN AEROBIC AND ANAEROBIC 10CC  Final   Culture  Setup Time   Final    GRAM NEGATIVE RODS IN BOTH AEROBIC AND ANAEROBIC BOTTLES CRITICAL RESULT CALLED TO, READ BACK BY AND VERIFIED WITHHewitt Shorts RN 2327 09/16/15 A BROWNING    Culture   Final    ESCHERICHIA COLI SUSCEPTIBILITIES PERFORMED ON PREVIOUS CULTURE WITHIN THE LAST 5 DAYS. Performed at [facility]    Report Status 09/18/2015 FINAL  Final  Urine culture     Status: None   Collection Time: 09/16/15  9:55 AM  Result Value Ref Range Status   Specimen Description URINE, CLEAN CATCH  Final   Special Requests NONE  Final   Culture   Final    MULTIPLE SPECIES PRESENT, SUGGEST RECOLLECTION Performed at [facility]    Report Status 09/17/2015 FINAL  Final  Gastrointestinal Panel by PCR , Stool     Status: None   Collection Time: 09/16/15  5:48 PM   Result Value Ref Range Status   Campylobacter species NOT DETECTED NOT DETECTED Final   Plesimonas shigelloides NOT DETECTED NOT DETECTED Final   Salmonella species NOT DETECTED NOT DETECTED Final   Yersinia enterocolitica NOT DETECTED NOT DETECTED Final   Vibrio species NOT DETECTED NOT DETECTED Final   Vibrio cholerae NOT DETECTED NOT DETECTED Final   Enteroaggregative E coli (EAEC) NOT DETECTED NOT DETECTED Final   Enteropathogenic E coli (EPEC) NOT DETECTED NOT DETECTED Final  Enterotoxigenic E coli (ETEC) NOT DETECTED NOT DETECTED Final   Shiga like toxin producing E coli (STEC) NOT DETECTED NOT DETECTED Final   E. coli O157 NOT DETECTED NOT DETECTED Final   Shigella/Enteroinvasive E coli (EIEC) NOT DETECTED NOT DETECTED Final   Cryptosporidium NOT DETECTED NOT DETECTED Final   Cyclospora cayetanensis NOT DETECTED NOT DETECTED Final   Entamoeba histolytica NOT DETECTED NOT DETECTED Final   Giardia lamblia NOT DETECTED NOT DETECTED Final   Adenovirus F40/41 NOT DETECTED NOT DETECTED Final   Astrovirus NOT DETECTED NOT DETECTED Final   Norovirus GI/GII NOT DETECTED NOT DETECTED Final   Rotavirus A NOT DETECTED NOT DETECTED Final   Sapovirus (I, II, IV, and V) NOT DETECTED NOT DETECTED Final  C difficile quick scan w PCR reflex     Status: Abnormal   Collection Time: 09/16/15  5:48 PM  Result Value Ref Range Status   C Diff antigen POSITIVE (A) NEGATIVE Final   C Diff toxin POSITIVE (A) NEGATIVE Final   C Diff interpretation Positive for toxigenic C. difficile  Final    Comment: CRITICAL RESULT CALLED TO, READ BACK BY AND VERIFIED WITH: Britta Mccreedy [id number] @ 1902 BY J SCOTTON      Studies: No results found.  Scheduled Meds: . ceFEPime (MAXIPIME) IV  2 g Intravenous Q24H  . LORazepam  0.5 mg Oral QHS  . metronidazole  500 mg Intravenous Q8H  . ondansetron (ZOFRAN) IV  4 mg Intravenous 4 times per day  . pantoprazole (PROTONIX) IV  40 mg Intravenous Q12H  . sodium  chloride flush  3 mL Intravenous Q12H  . Tbo-Filgrastim  300 mcg Subcutaneous Daily  . vancomycin  500 mg Oral 4 times per day   Continuous Infusions: . sodium chloride 75 mL/hr at 09/18/15 0200   Antibiotics Given (last 72 hours)    Date/Time Action Medication Dose Rate   09/16/15 0944 Given   vancomycin (VANCOCIN) IVPB 1000 mg/200 mL premix 1,000 mg 200 mL/hr   09/16/15 1226 Given   ceFEPIme (MAXIPIME) 2 g in dextrose 5 % 50 mL IVPB 2 g 100 mL/hr   09/17/15 0827 Given   ceFEPIme (MAXIPIME) 2 g in dextrose 5 % 50 mL IVPB 2 g 100 mL/hr   09/17/15 1030 Given   metroNIDAZOLE (FLAGYL) IVPB 500 mg 500 mg 100 mL/hr   09/17/15 1216 Given   vancomycin (VANCOCIN) 50 mg/mL oral solution 500 mg 500 mg    09/17/15 1744 Given   vancomycin (VANCOCIN) 50 mg/mL oral solution 500 mg 500 mg    09/17/15 1744 Given   metroNIDAZOLE (FLAGYL) IVPB 500 mg 500 mg 100 mL/hr   09/18/15 0032 Given   vancomycin (VANCOCIN) 50 mg/mL oral solution 500 mg 500 mg    09/18/15 0100 Given   metroNIDAZOLE (FLAGYL) IVPB 500 mg 500 mg 100 mL/hr   09/18/15 0535 Given   vancomycin (VANCOCIN) 50 mg/mL oral solution 500 mg 500 mg    09/18/15 0902 Given   ceFEPIme (MAXIPIME) 2 g in dextrose 5 % 50 mL IVPB 2 g 100 mL/hr   09/18/15 1011 Given   metroNIDAZOLE (FLAGYL) IVPB 500 mg 500 mg 100 mL/hr   09/18/15 1154 Given   vancomycin (VANCOCIN) 50 mg/mL oral solution 500 mg 500 mg       Principal Problem:   Acute-on-chronic kidney injury ([place]) Active Problems:   GERD   Small bowel cancer (HCC)   Hypotension   Dehydration   Hyperkalemia   CKD (chronic kidney  disease) stage 3, GFR 30-59 ml/min   Diarrhea   Polycystic kidney disease   Chemotherapy-induced neutropenia (H[facility])   Intractable nausea and vomiting   Fever, unspecified   Vomiting   Enteritis due to Clostridium difficile   Bacteremia   E coli bacteremia    Time spent: 25 min    [place] [facility] Pager [phone] If 7PM-7AM, please  contact night-coverage at [email or web address], password [facility] 09/18/2015, 1:00 PM  LOS: 5 days

## 2015-09-19 ENCOUNTER — Encounter: Payer: Self-pay | Admitting: *Deleted

## 2015-09-19 LAB — COMPREHENSIVE METABOLIC PANEL
ALBUMIN: 1.8 g/dL — AB (ref 3.5–5.0)
ALK PHOS: 128 U/L — AB (ref 38–126)
ALT: 23 U/L (ref 14–54)
ANION GAP: 8 (ref 5–15)
AST: 19 U/L (ref 15–41)
BUN: 43 mg/dL — ABNORMAL HIGH (ref 6–20)
CALCIUM: 7.6 mg/dL — AB (ref 8.9–10.3)
CHLORIDE: 118 mmol/L — AB (ref 101–111)
CO2: 13 mmol/L — AB (ref 22–32)
Creatinine, Ser: 2.93 mg/dL — ABNORMAL HIGH (ref 0.44–1.00)
GFR calc Af Amer: 17 mL/min — ABNORMAL LOW (ref >60)
GFR calc non Af Amer: 14 mL/min — ABNORMAL LOW (ref >60)
GLUCOSE: 99 mg/dL (ref 65–99)
Potassium: 3.8 mmol/L (ref 3.5–5.1)
SODIUM: 139 mmol/L (ref 135–145)
Total Bilirubin: 0.9 mg/dL (ref 0.3–1.2)
Total Protein: 4.4 g/dL — ABNORMAL LOW (ref 6.5–8.1)

## 2015-09-19 LAB — CBC WITH DIFFERENTIAL/PLATELET
BASOS ABS: 0 10*3/uL (ref 0.0–0.1)
Basophils Relative: 0 %
EOS ABS: 0.1 10*3/uL (ref 0.0–0.7)
Eosinophils Relative: 10 %
HCT: 24.1 % — ABNORMAL LOW (ref 36.0–46.0)
HEMOGLOBIN: 8 g/dL — AB (ref 12.0–15.0)
LYMPHS PCT: 46 %
Lymphs Abs: 0.2 10*3/uL — ABNORMAL LOW (ref 0.7–4.0)
MCH: 28.6 pg (ref 26.0–34.0)
MCHC: 33.2 g/dL (ref 30.0–36.0)
MCV: 86.1 fL (ref 78.0–100.0)
MONO ABS: 0.1 10*3/uL (ref 0.1–1.0)
Monocytes Relative: 18 %
NEUTROS ABS: 0.2 10*3/uL — AB (ref 1.7–7.7)
NEUTROS PCT: 26 %
PLATELETS: 101 10*3/uL — AB (ref 150–400)
RBC: 2.8 MIL/uL — ABNORMAL LOW (ref 3.87–5.11)
RDW: 14.8 % (ref 11.5–15.5)
WBC: 0.6 10*3/uL — CL (ref 4.0–10.5)

## 2015-09-19 MED ORDER — NYSTATIN 100000 UNIT/ML MT SUSP
5.0000 mL | Freq: Four times a day (QID) | OROMUCOSAL | Status: DC
  Administered 2015-09-19 – 2015-09-20 (×5): 500000 [IU] via ORAL
  Filled 2015-09-19 (×8): qty 5

## 2015-09-19 MED ORDER — OXYMETAZOLINE HCL 0.05 % NA SOLN
1.0000 | Freq: Two times a day (BID) | NASAL | Status: DC
  Administered 2015-09-19 – 2015-09-22 (×7): 1 via NASAL
  Filled 2015-09-19: qty 15

## 2015-09-19 MED ORDER — FAMOTIDINE IN NACL 20-0.9 MG/50ML-% IV SOLN
20.0000 mg | Freq: Two times a day (BID) | INTRAVENOUS | Status: DC
  Administered 2015-09-19 – 2015-09-21 (×6): 20 mg via INTRAVENOUS
  Filled 2015-09-19 (×7): qty 50

## 2015-09-19 MED ORDER — CEFAZOLIN SODIUM 1-5 GM-% IV SOLN
1.0000 g | Freq: Two times a day (BID) | INTRAVENOUS | Status: AC
  Administered 2015-09-19: 1 g via INTRAVENOUS
  Filled 2015-09-19: qty 50

## 2015-09-19 MED ORDER — LORATADINE 10 MG PO TABS
10.0000 mg | ORAL_TABLET | Freq: Every day | ORAL | Status: DC
  Administered 2015-09-19 – 2015-09-23 (×5): 10 mg via ORAL
  Filled 2015-09-19 (×5): qty 1

## 2015-09-19 NOTE — NC FL2 (Signed)
[place] LEVEL OF CARE SCREENING TOOL     IDENTIFICATION  Patient Name: Erica Simpson Birthdate: 10-22-1935 Sex: female Admission Date (Current Location): 09/13/2015  [facility] and [place] Number:  Herbalist and Address:  [facility],  [place], [place]      Provider Number: [id number]  Attending Physician Name and Address:  Geradine Girt, DO  Relative Name and Phone Number:       Current Level of Care: Hospital Recommended Level of Care: [place] Prior Approval Number:    Date Approved/Denied:   PASRR Number: [id number]  Discharge Plan: SNF    Current Diagnoses: Patient Active Problem List   Diagnosis Date Noted  . E coli bacteremia 09/18/2015  . Enteritis due to Clostridium difficile 09/17/2015  . Bacteremia 09/17/2015  . Intractable nausea and vomiting   . Fever, unspecified   . Vomiting   . Chemotherapy-induced neutropenia ([place])   . Hypotension 09/13/2015  . Dehydration 09/13/2015  . Hyperkalemia 09/13/2015  . Acute-on-chronic kidney injury ([place]) 09/13/2015  . CKD (chronic kidney disease) stage 3, GFR 30-59 ml/min 09/13/2015  . Diarrhea 09/13/2015  . Polycystic kidney disease 09/13/2015  . Small bowel cancer ([place]) 09/07/2015  . GERD 08/25/2010  . DYSPHAGIA 08/25/2010  . PERSONAL HISTORY OF COLONIC POLYPS 08/25/2010    Orientation RESPIRATION BLADDER Height & Weight     Self, Time, Situation, Place  Normal Continent Weight: 154 lb 12.8 oz (70.217 kg) Height:  5\' 3"  (160 cm)  BEHAVIORAL SYMPTOMS/MOOD NEUROLOGICAL BOWEL NUTRITION STATUS      Continent Diet (Full liquid diet)  AMBULATORY STATUS COMMUNICATION OF NEEDS Skin   Extensive Assist Verbally Normal                       Personal Care Assistance Level of Assistance  Bathing, Dressing Bathing Assistance: Limited assistance   Dressing Assistance: Limited assistance     Functional Limitations Info              SPECIAL CARE FACTORS FREQUENCY  PT (By licensed PT), OT (By licensed OT)     PT Frequency: 5 OT Frequency: 5            Contractures      Additional Factors Info  Code Status, Allergies, Isolation Precautions Code Status Info: Fullcode Allergies Info: NKDA     Isolation Precautions Info: Enteric Precautions - positive CDiff     Current Medications (09/19/2015):  This is the current hospital active medication list Current Facility-Administered Medications  Medication Dose Route Frequency Provider Last Rate Last Dose  . 0.9 %  sodium chloride infusion   Intravenous Continuous Geradine Girt, DO 100 mL/hr at 09/19/15 0052    . acetaminophen (TYLENOL) tablet 650 mg  650 mg Oral Q6H PRN Lavina Hamman, MD   650 mg at 09/16/15 0617   Or  . acetaminophen (TYLENOL) suppository 650 mg  650 mg Rectal Q6H PRN Lavina Hamman, MD      . alum & mag hydroxide-simeth (MAALOX/MYLANTA) 200-200-20 MG/5ML suspension 15 mL  15 mL Oral Q4H PRN Geradine Girt, DO      . ceFEPIme (MAXIPIME) 2 g in dextrose 5 % 50 mL IVPB  2 g Intravenous Q24H Geradine Girt, DO   2 g at 09/19/15 0846  . HYDROcodone-acetaminophen (NORCO/VICODIN) 5-325 MG per tablet 1 tablet  1 tablet Oral Q4H PRN Lavina Hamman, MD      .  LORazepam (ATIVAN) tablet 0.5 mg  0.5 mg Oral QHS Lavina Hamman, MD   0.5 mg at 09/18/15 2036  . metroNIDAZOLE (FLAGYL) IVPB 500 mg  500 mg Intravenous Q8H Jessica U Vann, DO   500 mg at 09/19/15 1002  . morphine 2 MG/ML injection 2 mg  2 mg Intravenous Q4H PRN Lavina Hamman, MD      . ondansetron [facility]) injection 4 mg  4 mg Intravenous 4 times per day Geradine Girt, DO   4 mg at 09/19/15 [id number]  . promethazine (PHENERGAN) injection 12.5 mg  12.5 mg Intravenous Q6H PRN Geradine Girt, DO   12.5 mg at 09/18/15 0305  . sodium chloride flush (NS) 0.9 % injection 10-40 mL  10-40 mL Intracatheter PRN Ladell Pier, MD   10 mL at 09/16/15 0420  . sodium chloride flush (NS) 0.9 % injection 3 mL  3  mL Intravenous Q12H Lavina Hamman, MD   3 mL at 09/16/15 2240  . Tbo-Filgrastim (GRANIX) injection 300 mcg  300 mcg Subcutaneous Daily Ladell Pier, MD   300 mcg at 09/19/15 1003  . traMADol (ULTRAM) tablet 50 mg  50 mg Oral TID PRN Lavina Hamman, MD   50 mg at 09/16/15 1625  . vancomycin (VANCOCIN) 50 mg/mL oral solution 500 mg  500 mg Oral 4 times per day Geradine Girt, DO   500 mg at 09/19/15 [id number]     Discharge Medications: Please see discharge summary for a list of discharge medications.  Relevant Imaging Results:  Relevant Lab Results:   Additional Information SSN: [id number]  Standley Brooking, LCSW

## 2015-09-19 NOTE — Progress Notes (Signed)
Pharmacy Antibiotic Note  Erica Simpson is a 80 y.o. female admitted on 09/13/2015 with metastatic adenocarcinoma with duodenal mass s/p cycle 1 FOLFIRI 09/08/15, now with chemotherapy induced diarrhea, pancytopenia with severe anemia . On admission she had febrile (Tm 100.4) neutropenia (ANC = 0.0) and pharmacy was initially consulted for Vancomycin and Cefepime dosing.  She was narrowed to Cefepime alone then narrowed further to Cefazolin today.  Plan:  Cefazolin 1g IV q12h   Follow up renal fxn, repeat culture results, and clinical course.   Height: 5\' 3"  (160 cm) Weight: 154 lb 12.8 oz (70.217 kg) IBW/kg (Calculated) : 52.4  Temp (24hrs), Avg:97.9 F (36.6 C), Min:97.7 F (36.5 C), Max:98.1 F (36.7 C)   Recent Labs Lab 09/13/15 1815  09/14/15 0645 09/15/15 0526 09/16/15 0420 09/17/15 0348 09/18/15 0436 09/19/15 0534  WBC  --   < > 0.8* 0.4* 0.3* 0.3* 0.3* 0.6*  CREATININE  --   < > 2.84* 2.59* 2.68*  --  3.13* 2.93*  LATICACIDVEN 2.2*  --   --   --   --   --   --   --   < > = values in this interval not displayed.  Estimated Creatinine Clearance: 14.9 mL/min (by C-G formula based on Cr of 2.93).    No Known Allergies  Antimicrobials this admission: Vancomycin 2/24 >> 2/25 Cefepime 2/24 >> 2/27 Metronidazole 2/25 >> (3/11) Vanc PO 2/25 >> (3/11) Cefazolin 2/27 >>  Dose adjustments this admission: None  Microbiology results: 2/24 BCx: Ecoli (pan-sensitive) 2/24 CDiff PCR: positive Ag, positive toxin 2/24 GI Panel: none detected 2/24 Influenza PCR: negative 2/24 UCx: multiple species 2/27 BCx: sent  Thank you for allowing pharmacy to be a part of this patient's care.  Gretta Arab PharmD, BCPS Pager [phone] 09/19/2015 12:04 PM

## 2015-09-19 NOTE — Progress Notes (Addendum)
PROGRESS NOTE  [name] [id number] DOB: 03-Jul-1936 DOA: 09/13/2015 PCP: Erica Pac, MD  Erica Simpson is a 80 y.o. female with Past medical history of polycystic kidney disease, chronic kidney disease stage III,. 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 Patient was recently started on chemotherapy she had her first cycle completed on 09/08/2015.    Assessment/Plan: Acute-on-chronic kidney injury ([employer]) -baseline: 2.5 -increase IVF  N/V/diarrhea due to C diff colitis- patient had diarrhea upon admission but was initally attributed to chemo, known to cause diarrhea but when did not improve, c diff was ordered and was positive -IV flagyl and PO vanc (not sure if patient will be able to take in PO with vomiting) ?if need for CT scan vs small bowel to r/o partial obstruction- once able to tolerate PO better  x-ray of her abdomen ok  essential hypertension and presentation with hypotension. Hold home meds  Adenocarcinoma involving a duodenal mass  CT 08/27/2015 consistent with a duodenal mass/uncinate mass, liver metastases, pulmonary metastases, and metastatic lymphadenopathy  Cycle 1 FOLFIRI 09/08/2015 -Management per oncology. -poor overall prognosis-- ? palliative care eval while in hospital  Pancytopenia- side affect of chemo -started on G-CSF -blood cultures- gram neg rods -started IV abx- will narrow and repeat blood cultures -s/p 2 units PRBC -flu negative  ecoli bacteremia IV abx -repeat BC    Mobilize out of bed to chair   Code Status: full Family Communication: patient/son at bedside Disposition Plan: eventual SNF   Consultants:  Oncology  GI  Procedures:      HPI/Subjective: Felt hungry but vomited after getting mucus in throat-- has been going on for weeks at home as well  Objective: Filed Vitals:   09/19/15 0627 09/19/15 1327  BP: 124/69 125/73   Pulse: 94 90  Temp: 98 F (36.7 C) 97.8 F (36.6 C)  Resp: 16 18    Intake/Output Summary (Last 24 hours) at 09/19/15 1537 Last data filed at 09/19/15 1236  Gross per 24 hour  Intake   2780 ml  Output      0 ml  Net   2780 ml   Filed Weights   09/17/15 0547 09/18/15 0526 09/19/15 0627  Weight: 66 kg (145 lb 8.1 oz) 70.4 kg (155 lb 3.3 oz) 70.217 kg (154 lb 12.8 oz)    Exam:   General:  Ill appearing  Cardiovascular: rrr  Respiratory: clear  Abdomen: +Bs, tender with deep palpation  Musculoskeletal: no edema  Data Reviewed: Basic Metabolic Panel:  Recent Labs Lab 09/13/15 1830 09/14/15 0645 09/15/15 0526 09/16/15 0420 09/18/15 0436 09/19/15 0534  NA 136 142 141 139 139 139  K 4.8 5.0 4.5 4.4 4.0 3.8  CL 103 113* 114* 112* 117* 118*  CO2 21* 20* 18* 17* 14* 13*  GLUCOSE 131* 98 106* 121* 97 99  BUN 72* 58* 46* 39* 46* 43*  CREATININE 2.92* 2.84* 2.59* 2.68* 3.13* 2.93*  CALCIUM 9.3 8.1* 7.9* 7.8* 7.4* 7.6*  MG 2.4  --   --   --   --   --    Liver Function Tests:  Recent Labs Lab 09/13/15 1430 09/13/15 1830 09/14/15 0645 09/19/15 0534  AST 67* 56* 46* 19  ALT 68* 56* 43 23  ALKPHOS 400* 304* 244* 128*  BILITOT 0.66 0.6 0.7 0.9  PROT 7.0 6.1* 5.2* 4.4*  ALBUMIN 2.7* 2.5* 2.2* 1.8*   No results for input(s): LIPASE, AMYLASE in  the last 168 hours. No results for input(s): AMMONIA in the last 168 hours. CBC:  Recent Labs Lab 09/15/15 0526 09/16/15 0420 09/17/15 0348 09/18/15 0436 09/19/15 0534  WBC 0.4* 0.3* 0.3* 0.3* 0.6*  NEUTROABS 0.1* 0.0* 0.0* 0.0* 0.2*  HGB 8.9* 9.5* 8.6* 7.8* 8.0*  HCT 27.1* 28.9* 26.2* 23.9* 24.1*  MCV 87.4 87.0 86.8 86.9 86.1  PLT 121* 116* 108* 84* 101*   Cardiac Enzymes: No results for input(s): CKTOTAL, CKMB, CKMBINDEX, TROPONINI in the last 168 hours. BNP (last 3 results) No results for input(s): BNP in the last 8760 hours.  ProBNP (last 3 results) No results for input(s): PROBNP in the last 8760  hours.  CBG: No results for input(s): GLUCAP in the last 168 hours.  Recent Results (from the past 240 hour(s))  Culture, blood (Routine X 2) w Reflex to ID Panel     Status: None   Collection Time: 09/16/15  8:48 AM  Result Value Ref Range Status   Specimen Description BLOOD BLOOD RIGHT WRIST  Final   Special Requests BOTTLES DRAWN AEROBIC AND ANAEROBIC 10CC  Final   Culture  Setup Time   Final    GRAM NEGATIVE RODS IN BOTH AEROBIC AND ANAEROBIC BOTTLES CRITICAL RESULT CALLED TO, READ BACK BY AND VERIFIED WITHHewitt Shorts RN 2327 09/16/15 A BROWNING    Culture   Final    ESCHERICHIA COLI Performed at [facility]    Report Status 09/18/2015 FINAL  Final   Organism ID, Bacteria ESCHERICHIA COLI  Final      Susceptibility   Escherichia coli - MIC*    AMPICILLIN <=2 SENSITIVE Sensitive     CEFAZOLIN <=4 SENSITIVE Sensitive     CEFEPIME <=1 SENSITIVE Sensitive     CEFTAZIDIME <=1 SENSITIVE Sensitive     CEFTRIAXONE <=1 SENSITIVE Sensitive     CIPROFLOXACIN <=0.25 SENSITIVE Sensitive     GENTAMICIN <=1 SENSITIVE Sensitive     IMIPENEM <=0.25 SENSITIVE Sensitive     TRIMETH/SULFA <=20 SENSITIVE Sensitive     AMPICILLIN/SULBACTAM <=2 SENSITIVE Sensitive     PIP/TAZO <=4 SENSITIVE Sensitive     * ESCHERICHIA COLI  Culture, blood (Routine X 2) w Reflex to ID Panel     Status: None   Collection Time: 09/16/15  8:48 AM  Result Value Ref Range Status   Specimen Description BLOOD RIGHT ARM  Final   Special Requests BOTTLES DRAWN AEROBIC AND ANAEROBIC 10CC  Final   Culture  Setup Time   Final    GRAM NEGATIVE RODS IN BOTH AEROBIC AND ANAEROBIC BOTTLES CRITICAL RESULT CALLED TO, READ BACK BY AND VERIFIED WITHHewitt Shorts RN 2327 09/16/15 A BROWNING    Culture   Final    ESCHERICHIA COLI SUSCEPTIBILITIES PERFORMED ON PREVIOUS CULTURE WITHIN THE LAST 5 DAYS. Performed at [facility]    Report Status 09/18/2015 FINAL  Final  Urine culture     Status: None   Collection  Time: 09/16/15  9:55 AM  Result Value Ref Range Status   Specimen Description URINE, CLEAN CATCH  Final   Special Requests NONE  Final   Culture   Final    MULTIPLE SPECIES PRESENT, SUGGEST RECOLLECTION Performed at [facility]    Report Status 09/17/2015 FINAL  Final  Gastrointestinal Panel by PCR , Stool     Status: None   Collection Time: 09/16/15  5:48 PM  Result Value Ref Range Status   Campylobacter species NOT DETECTED NOT DETECTED Final  Plesimonas shigelloides NOT DETECTED NOT DETECTED Final   Salmonella species NOT DETECTED NOT DETECTED Final   Yersinia enterocolitica NOT DETECTED NOT DETECTED Final   Vibrio species NOT DETECTED NOT DETECTED Final   Vibrio cholerae NOT DETECTED NOT DETECTED Final   Enteroaggregative E coli (EAEC) NOT DETECTED NOT DETECTED Final   Enteropathogenic E coli (EPEC) NOT DETECTED NOT DETECTED Final   Enterotoxigenic E coli (ETEC) NOT DETECTED NOT DETECTED Final   Shiga like toxin producing E coli (STEC) NOT DETECTED NOT DETECTED Final   E. coli O157 NOT DETECTED NOT DETECTED Final   Shigella/Enteroinvasive E coli (EIEC) NOT DETECTED NOT DETECTED Final   Cryptosporidium NOT DETECTED NOT DETECTED Final   Cyclospora cayetanensis NOT DETECTED NOT DETECTED Final   Entamoeba histolytica NOT DETECTED NOT DETECTED Final   Giardia lamblia NOT DETECTED NOT DETECTED Final   Adenovirus F40/41 NOT DETECTED NOT DETECTED Final   Astrovirus NOT DETECTED NOT DETECTED Final   Norovirus GI/GII NOT DETECTED NOT DETECTED Final   Rotavirus A NOT DETECTED NOT DETECTED Final   Sapovirus (I, II, IV, and V) NOT DETECTED NOT DETECTED Final  C difficile quick scan w PCR reflex     Status: Abnormal   Collection Time: 09/16/15  5:48 PM  Result Value Ref Range Status   C Diff antigen POSITIVE (A) NEGATIVE Final   C Diff toxin POSITIVE (A) NEGATIVE Final   C Diff interpretation Positive for toxigenic C. difficile  Final    Comment: CRITICAL RESULT CALLED TO, READ  BACK BY AND VERIFIED WITH: Britta Mccreedy IT:[id number] @ 1902 BY J SCOTTON      Studies: No results found.  Scheduled Meds: .  ceFAZolin (ANCEF) IV  1 g Intravenous Q12H  . famotidine (PEPCID) IV  20 mg Intravenous Q12H  . loratadine  10 mg Oral Daily  . LORazepam  0.5 mg Oral QHS  . metronidazole  500 mg Intravenous Q8H  . nystatin  5 mL Oral QID  . ondansetron (ZOFRAN) IV  4 mg Intravenous 4 times per day  . oxymetazoline  1 spray Each Nare BID  . sodium chloride flush  3 mL Intravenous Q12H  . Tbo-Filgrastim  300 mcg Subcutaneous Daily  . vancomycin  500 mg Oral 4 times per day   Continuous Infusions: . sodium chloride 100 mL/hr at 09/19/15 0052   Antibiotics Given (last 72 hours)    Date/Time Action Medication Dose Rate   09/17/15 0827 Given   ceFEPIme (MAXIPIME) 2 g in dextrose 5 % 50 mL IVPB 2 g 100 mL/hr   09/17/15 1030 Given   metroNIDAZOLE (FLAGYL) IVPB 500 mg 500 mg 100 mL/hr   09/17/15 1216 Given   vancomycin (VANCOCIN) 50 mg/mL oral solution 500 mg 500 mg    09/17/15 1744 Given   vancomycin (VANCOCIN) 50 mg/mL oral solution 500 mg 500 mg    09/17/15 1744 Given   metroNIDAZOLE (FLAGYL) IVPB 500 mg 500 mg 100 mL/hr   09/18/15 0032 Given   vancomycin (VANCOCIN) 50 mg/mL oral solution 500 mg 500 mg    09/18/15 0100 Given   metroNIDAZOLE (FLAGYL) IVPB 500 mg 500 mg 100 mL/hr   09/18/15 0535 Given   vancomycin (VANCOCIN) 50 mg/mL oral solution 500 mg 500 mg    09/18/15 0902 Given   ceFEPIme (MAXIPIME) 2 g in dextrose 5 % 50 mL IVPB 2 g 100 mL/hr   09/18/15 1011 Given   metroNIDAZOLE (FLAGYL) IVPB 500 mg 500 mg 100 mL/hr   09/18/15  1154 Given   vancomycin (VANCOCIN) 50 mg/mL oral solution 500 mg 500 mg    09/18/15 1758 Given   vancomycin (VANCOCIN) 50 mg/mL oral solution 500 mg 500 mg    09/18/15 1758 Given   metroNIDAZOLE (FLAGYL) IVPB 500 mg 500 mg 100 mL/hr   09/19/15 0052 Given   vancomycin (VANCOCIN) 50 mg/mL oral solution 500 mg 500 mg    09/19/15 0200  Given   metroNIDAZOLE (FLAGYL) IVPB 500 mg 500 mg 100 mL/hr   09/19/15 [id number] Given   vancomycin (VANCOCIN) 50 mg/mL oral solution 500 mg 500 mg    09/19/15 [id number] Given   ceFEPIme (MAXIPIME) 2 g in dextrose 5 % 50 mL IVPB 2 g 100 mL/hr   09/19/15 1002 Given   metroNIDAZOLE (FLAGYL) IVPB 500 mg 500 mg 100 mL/hr   09/19/15 1237 Given   vancomycin (VANCOCIN) 50 mg/mL oral solution 500 mg 500 mg       Principal Problem:   Acute-on-chronic kidney injury ([place]) Active Problems:   GERD   Small bowel cancer (HCC)   Hypotension   Dehydration   Hyperkalemia   CKD (chronic kidney disease) stage 3, GFR 30-59 ml/min   Diarrhea   Polycystic kidney disease   Chemotherapy-induced neutropenia (HCC)   Intractable nausea and vomiting   Fever, unspecified   Vomiting   Enteritis due to Clostridium difficile   Bacteremia   E coli bacteremia    Time spent: 25 min    [place] [facility] Pager [phone] If 7PM-7AM, please contact night-coverage at [email or web address], password [facility] 09/19/2015, 3:37 PM  LOS: 6 days

## 2015-09-19 NOTE — Progress Notes (Signed)
IP PROGRESS NOTE  Subjective:  She reports improvement in nausea. She wants to eat this morning. The diarrhea has improved.   Objective: Vital signs in last 24 hours: Blood pressure 124/69, pulse 94, temperature 98 F (36.7 C), temperature source Oral, resp. rate 16, height 5\' 3"  (1.6 m), weight 154 lb 12.8 oz (70.217 kg), SpO2 98 %.  Intake/Output from previous day: 02/26 0701 - 02/27 0700 In: 3485 [P.O.:1160; I.V.:1925; IV Piggyback:400] Out: -   Physical Exam:  HEENT: No thrush Lungs: Clear anteriorly Cardiac: Regular rate and rhythm Abdomen: Mildly distended, tender in the right lower abdomen Extremities: No leg edema   Portacath/PICC-without erythema  Lab Results:  Recent Labs  09/18/15 0436 09/19/15 0534  WBC 0.3* 0.6*  HGB 7.8* 8.0*  HCT 23.9* 24.1*  PLT 84* 101*   ANC-0.2  BMET  Recent Labs  09/18/15 0436 09/19/15 0534  NA 139 139  K 4.0 3.8  CL 117* 118*  CO2 14* 13*  GLUCOSE 97 99  BUN 46* 43*  CREATININE 3.13* 2.93*  CALCIUM 7.4* 7.6*    Studies/Results: No results found.  Medications: I have reviewed the patient's current medications.  Assessment/Plan: 1. Adenocarcinoma involving a duodenal mass  CT 08/27/2015 consistent with a duodenal mass/uncinate mass, liver metastases, pulmonary metastases, and metastatic lymphadenopathy  Cycle 1 FOLFIRI 09/08/2015  2. Anorexia/weight loss  3.Nausea/vomiting-most likely secondary to the duodenal mass,? Obstruction  4. Polycystic kidney disease/renal failure  5. Anemia secondary to renal failure, bleeding from the duodenal tumor, and chemotherapy, status post a red cell transfusion 09/14/2015  6.   Neutropenia/thrombocytopenia secondary to chemotherapy-G-CSF started 09/14/2015  7.   History of diarrhea secondary chemotherapy  8.   Escherichia coli bacteremia  9.   C. difficile colitis-Flagyl started 09/17/2015  Ms. Borsuk appears improved. She is being treated with Flagyl for  C. difficile colitis an cefepime for Escherichia coli bacteremia. The white count and platelets are higher today.  She remains weak and may benefit from physical therapy. Discussed the situation with her son. She will need skilled nursing facility placement at discharge. We will make a decision on further chemotherapy depending on her performance status over the next few weeks.  Recommendations: 1. Continue G-CSF, daily CBC 2. Continue cefepime for treatment of the Escherichia coli bacteremia 3. Flagyl for C. difficile colitis 4. Proceed with plan for skilled nursing facility placement at discharge    LOS: 6 days   Betsy Coder, MD   09/19/2015, 8:40 AM

## 2015-09-19 NOTE — Clinical Social Work Note (Signed)
Clinical Social Work Assessment  Patient Details  Name: Erica Simpson MRN: [id number] Date of Birth: 07/05/1936  Date of referral:  09/19/15               Reason for consult:  Facility Placement                Permission sought to share information with:  Chartered certified accountant granted to share information::  Yes, Verbal Permission Granted  Name::        Agency::     Relationship::     Contact Information:     Housing/Transportation Living arrangements for the past 2 months:  Single Family Home Source of Information:  Patient, Adult Children Patient Interpreter Needed:  None Criminal Activity/Legal Involvement Pertinent to Current Situation/Hospitalization:  No - Comment as needed Significant Relationships:  Adult Children Lives with:  Self Do you feel safe going back to the place where you live?  No Need for family participation in patient care:  Yes (Comment)  Care giving concerns:  CSW met with patient's son, [name] at bedside re: discharge planning.    Social Worker assessment / plan:  CSW noted that PT has been unable to work with patient due to CDiff, PT to be re-ordered. Patient/son are requesting [facility] as backup. CSW completed FL2 and sent information out to [facility]. CSW left message for [place] at [place], awaiting call back re: bed offer.   Employment status:  Retired Forensic scientist:  Medicare PT Recommendations:  Not assessed at this time Information / Referral to community resources:  [place]  Patient/Family's Response to care:  Patient informed CSW that patient's sister had been to [facility] in the past, but would like [facility] as first choice.   Patient/Family's Understanding of and Emotional Response to Diagnosis, Current Treatment, and Prognosis:    Emotional Assessment Appearance:  Appears stated age Attitude/Demeanor/Rapport:    Affect (typically observed):    Orientation:     Alcohol / Substance use:    Psych involvement (Current and /or in the community):     Discharge Needs  Concerns to be addressed:    Readmission within the last 30 days:    Current discharge risk:    Barriers to Discharge:      Standley Brooking, LCSW 09/19/2015, 10:22 AM

## 2015-09-19 NOTE — Progress Notes (Signed)
[place] Work  Clinical Social Work was referred by patient's son for assessment of psychosocial needs due to questions about possible SNF placement.  Clinical Social Worker has worked with pt and son once before re. Possible care needs in the home. CSW explained inpatient CSW assists with SNF as they are based inpt and are aware of pt's current needs. Son eager to speak with inpt CSW and CSW sent message to Raynaldo Opitz for follow up. CSW explained to pt's son that pt needed PT eval in order to proceed with SNF placement. He stated understanding, but shared they are most interested in [place] SNF as a family friend was recently there for rehab. This CSW explained that inpt CSW was best resource for this process as there were many factors involved such as if facility had a bed, if in-network with insurance, etc. Son stated understanding and appreciated explanation. Son aware [facility] CSW notified and she will follow up, but it may not be until after PT eval is completed.   Clinical Social Work interventions:  Resource education Supportive listening  Loren Racer, [place] Worker [place]  [place] Phone: [phone] Fax: [phone]

## 2015-09-19 NOTE — Evaluation (Signed)
Physical Therapy Evaluation Patient Details Name: Erica Simpson MRN: [id number] DOB: [birth year] Today's Date: 09/19/2015   History of Present Illness  80 yo female admitted with acute on chronic kidney failure, C-diff. Hx of duodenal cancer, neutropenia, polycystic kidney disease, CKD.   Clinical Impression  On eval, pt required Min assist for mobility-walked ~150 feet while holding onto IV pole for stability/support. Assist needed to stabilize during ambulation. Pt tolerated distance fairly well. Feel pt would benefit from ST rehab at SNF to maximize safety with functional mobility and to regain PLOF/independence.     Follow Up Recommendations SNF    Equipment Recommendations  None recommended by PT    Recommendations for Other Services       Precautions / Restrictions Precautions Precautions: Fall;Other (comment) Precaution Comments: C-diff Restrictions Weight Bearing Restrictions: No      Mobility  Bed Mobility               General bed mobility comments: supervision for IV line  Transfers Overall transfer level: Needs assistance   Transfers: Sit to/from Stand Sit to Stand: Min guard         General transfer comment: close guard for safety.   Ambulation/Gait Ambulation/Gait assistance: Min assist Ambulation Distance (Feet): 150 Feet Assistive device:  (IV pole) Gait Pattern/deviations: Step-through pattern;Decreased stride length;Drifts right/left;Staggering right;Staggering left     General Gait Details: Assist to stabilize along with pt using pole for stability. Fair gait speed. Pt tolerated distance fairly well.  Stairs            Wheelchair Mobility    Modified Rankin (Stroke Patients Only)       Balance Overall balance assessment: Needs assistance         Standing balance support: During functional activity Standing balance-Leahy Scale: Fair                               Pertinent Vitals/Pain Pain Assessment:  No/denies pain    Home Living Family/patient expects to be discharged to:: Skilled nursing facility Living Arrangements: Alone   Type of Home: House Home Access: Stairs to enter Entrance Stairs-Rails: Psychiatric nurse of Steps: 3-4 Home Layout: One level Home Equipment: None      Prior Function Level of Independence: Independent               Hand Dominance        Extremity/Trunk Assessment   Upper Extremity Assessment: Generalized weakness           Lower Extremity Assessment: Generalized weakness      Cervical / Trunk Assessment: Normal  Communication   Communication: No difficulties  Cognition Arousal/Alertness: Awake/alert Behavior During Therapy: WFL for tasks assessed/performed Overall Cognitive Status: Within Functional Limits for tasks assessed                      General Comments      Exercises        Assessment/Plan    PT Assessment Patient needs continued PT services  PT Diagnosis Difficulty walking;Generalized weakness   PT Problem List Decreased strength;Decreased activity tolerance;Decreased balance;Decreased mobility  PT Treatment Interventions DME instruction;Gait training;Functional mobility training;Therapeutic activities;Patient/family education;Balance training;Therapeutic exercise   PT Goals (Current goals can be found in the Care Plan section) Acute Rehab PT Goals Patient Stated Goal: to regain PLOF/independence PT Goal Formulation: With patient Time For Goal Achievement: 10/03/15 Potential to Achieve Goals: Good  Frequency Min 3X/week   Barriers to discharge        Co-evaluation               End of Session Equipment Utilized During Treatment: Gait belt Activity Tolerance: Patient tolerated treatment well Patient left: in bed;with call bell/phone within reach;with family/visitor present           Time: 1345-1402 PT Time Calculation (min) (ACUTE ONLY): 17 min   Charges:    PT Evaluation $PT Eval Moderate Complexity: 1 Procedure     PT G Codes:        Weston Anna, MPT Pager: [phone]

## 2015-09-19 NOTE — Progress Notes (Signed)
Pharmacy Antibiotic Note  Erica Simpson is a 80 y.o. female admitted on 09/13/2015 with metastatic adenocarcinoma with duodenal mass s/p cycle 1 FOLFIRI 09/08/15, now with chemotherapy induced diarrhea, pancytopenia with severe anemia . On admission she had febrile (Tm 100.4) neutropenia (ANC = 0.0) and pharmacy was initially consulted for Vancomycin and Cefepime dosing.  She was narrowed to Cefepime alone on 2/25 pending cultures.  Plan:  Continue Cefepime 2g IV q24h   Follow up renal fxn, repeat culture results, and clinical course.  Follow up narrowing antibiotics: recommend Cefazolin 1g IV q12h for pan-sensitive Ecoli bacteremia.   Height: 5\' 3"  (160 cm) Weight: 154 lb 12.8 oz (70.217 kg) IBW/kg (Calculated) : 52.4  Temp (24hrs), Avg:97.9 F (36.6 C), Min:97.7 F (36.5 C), Max:98.1 F (36.7 C)   Recent Labs Lab 09/13/15 1815  09/14/15 0645 09/15/15 0526 09/16/15 0420 09/17/15 0348 09/18/15 0436 09/19/15 0534  WBC  --   < > 0.8* 0.4* 0.3* 0.3* 0.3* 0.6*  CREATININE  --   < > 2.84* 2.59* 2.68*  --  3.13* 2.93*  LATICACIDVEN 2.2*  --   --   --   --   --   --   --   < > = values in this interval not displayed.  Estimated Creatinine Clearance: 14.9 mL/min (by C-G formula based on Cr of 2.93).    No Known Allergies  Antimicrobials this admission: Cefepime 2/24 >>  Vancomycin 2/24 >> 2/25 Metronidazole 2/25 >> (3/11) Vanc PO 2/25 >> (3/11)  Dose adjustments this admission: None  Microbiology results: 2/24 BCx: Ecoli (pan-sensitive) 2/24 CDiff PCR: positive Ag, positive toxin 2/24 GI Panel: none detected 2/24 Influenza PCR: negative 2/24 UCx: multiple species 2/27 BCx: sent  Thank you for allowing pharmacy to be a part of this patient's care.  Gretta Arab PharmD, BCPS Pager [phone] 09/19/2015 8:50 AM

## 2015-09-19 NOTE — Clinical Social Work Placement (Signed)
   CLINICAL SOCIAL WORK PLACEMENT  NOTE  Date:  09/19/2015  Patient Details  Name: Erica Simpson MRN: [id number] Date of Birth: 10-30-1935  Clinical Social Work is seeking post-discharge placement for this patient at the [place] level of care (*CSW will initial, date and re-position this form in  chart as items are completed):  Yes   Patient/family provided with [facility] Work Department's list of facilities offering this level of care within the geographic area requested by the patient (or if unable, by the patient's family).  Yes   Patient/family informed of their freedom to choose among providers that offer the needed level of care, that participate in Medicare, Medicaid or managed care program needed by the patient, have an available bed and are willing to accept the patient.  Yes   Patient/family informed of [facility]'s ownership interest in [facility] and [facility], as well as of the fact that they are under no obligation to receive care at these facilities.  PASRR submitted to EDS on 09/19/15     PASRR number received on 09/19/15     Existing PASRR number confirmed on       FL2 transmitted to all facilities in geographic area requested by pt/family on 09/19/15     FL2 transmitted to all facilities within larger geographic area on       Patient informed that his/her managed care company has contracts with or will negotiate with certain facilities, including the following:            Patient/family informed of bed offers received.  Patient chooses bed at       Physician recommends and patient chooses bed at      Patient to be transferred to   on  .  Patient to be transferred to facility by       Patient family notified on   of transfer.  Name of family member notified:        PHYSICIAN       Additional Comment:    _______________________________________________ Standley Brooking, LCSW 09/19/2015, 10:26 AM

## 2015-09-20 ENCOUNTER — Other Ambulatory Visit: Payer: Self-pay | Admitting: *Deleted

## 2015-09-20 ENCOUNTER — Telehealth: Payer: Self-pay | Admitting: Oncology

## 2015-09-20 DIAGNOSIS — D6481 Anemia due to antineoplastic chemotherapy: Secondary | ICD-10-CM

## 2015-09-20 DIAGNOSIS — Z515 Encounter for palliative care: Secondary | ICD-10-CM

## 2015-09-20 DIAGNOSIS — Z66 Do not resuscitate: Secondary | ICD-10-CM

## 2015-09-20 DIAGNOSIS — K219 Gastro-esophageal reflux disease without esophagitis: Secondary | ICD-10-CM | POA: Diagnosis present

## 2015-09-20 DIAGNOSIS — N183 Chronic kidney disease, stage 3 (moderate): Secondary | ICD-10-CM

## 2015-09-20 DIAGNOSIS — Z7189 Other specified counseling: Secondary | ICD-10-CM

## 2015-09-20 DIAGNOSIS — D696 Thrombocytopenia, unspecified: Secondary | ICD-10-CM

## 2015-09-20 LAB — CBC WITH DIFFERENTIAL/PLATELET
BASOS ABS: 0 10*3/uL (ref 0.0–0.1)
BASOS PCT: 0 %
EOS ABS: 0.1 10*3/uL (ref 0.0–0.7)
Eosinophils Relative: 6 %
HEMATOCRIT: 23 % — AB (ref 36.0–46.0)
HEMOGLOBIN: 7.6 g/dL — AB (ref 12.0–15.0)
LYMPHS PCT: 28 %
Lymphs Abs: 0.4 10*3/uL — ABNORMAL LOW (ref 0.7–4.0)
MCH: 28.4 pg (ref 26.0–34.0)
MCHC: 33 g/dL (ref 30.0–36.0)
MCV: 85.8 fL (ref 78.0–100.0)
MONOS PCT: 17 %
Monocytes Absolute: 0.3 10*3/uL (ref 0.1–1.0)
NEUTROS PCT: 49 %
Neutro Abs: 0.8 10*3/uL — ABNORMAL LOW (ref 1.7–7.7)
Platelets: 98 10*3/uL — ABNORMAL LOW (ref 150–400)
RBC: 2.68 MIL/uL — ABNORMAL LOW (ref 3.87–5.11)
RDW: 15.2 % (ref 11.5–15.5)
WBC: 1.6 10*3/uL — ABNORMAL LOW (ref 4.0–10.5)

## 2015-09-20 MED ORDER — LORAZEPAM 0.5 MG PO TABS
0.5000 mg | ORAL_TABLET | Freq: Two times a day (BID) | ORAL | Status: DC | PRN
  Administered 2015-09-20: 0.5 mg via ORAL
  Filled 2015-09-20: qty 1

## 2015-09-20 MED ORDER — SCOPOLAMINE 1 MG/3DAYS TD PT72
1.0000 | MEDICATED_PATCH | TRANSDERMAL | Status: DC
  Administered 2015-09-20: 1.5 mg via TRANSDERMAL
  Filled 2015-09-20: qty 1

## 2015-09-20 NOTE — Consult Note (Signed)
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  She was admitted to [facility] hospital on 2/21 and has been found to have C-diff and ecoli bacteremia.  She had her 1st chemo treatment on 2/16.  Her oncologist is Erica Simpson.   Clinical Assessment/Narrative: Erica Simpson is a lovely independent [place] woman from [place].  She moved to [place] in 1987.  She has one son Erica Simpson [facility]) and a sister Erica Simpson who also live her in town.  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  Her sister says everything she creates turns out perfectly.  She has 4 cats and is concerned about what will happen to them.  Erica Simpson tells me she does not want any more chemo therapy.  She's had problems with her immune system her entire life and feels her body will not tolerate chemo.  She is focused on Quality of Life. She states this very clearly in front of her son and sister.  She is a DNR and plans to recover from her current infection.  Be discharged to [facility] for a short period of rehab to regain some strength, and then return to her home with the support of her family and hospice.   Erica Simpson has offered to be her hospice doctor.  She denies pain.  She complains most about excess saliva.  This is particularly bad after eating - she gets a sensation of fullness in her throat and vomits mucous.   She also has a sore spot on her left foot making it painful to walk.  Her focus is in improving Quality of  Life.  She does not want aggressive/invasive treatments.    Contacts/Participants in [place], son Erica Simpson. Primary Decision Maker: patient   HCPOA: Son Erica Simpson   SUMMARY OF RECOMMENDATIONS  Code Status/Advance Care Planning: DNR     Symptom Management:   Trial of scopolamine patch for excess secretions (if this is too drying - please d/c)  GERD precautions when eating.  Sit up, small low fat meals, avoid mints, choc, acids  WOC for suggestions regarding painful spot (blister? Plantar's wart?) on left foot.    Additional Recommendations (Limitations, Scope, Preferences):  Erica Simpson will have the patient follow up with him next week and order hospice services at that time.  Psycho-social/Spiritual:  Support System: Strong   Prognosis: likely only a few months.  Patient is eating very little, quickly losing weight and has aggressive metastatic cancer in her pancreas, duodenum, lungs, and liver.  Discharge Planning: [place] for rehab with Palliative care service follow-up Subsequently home with hospice with Erica Simpson assistance.  Chief Complaint/ Primary Diagnoses: Present on Admission:  . 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 GERD . Hypotension . Dehydration . Hyperkalemia . Acute-on-chronic kidney injury ([place]) . CKD (chronic kidney disease) stage 3, GFR 30-59 ml/min . Diarrhea  I have reviewed the medical record, interviewed the patient and family, and examined the patient. The following aspects are pertinent.  Past Medical History  Diagnosis Date  . Adenomatous colon polyp  02/2001  . Diverticulosis   . Internal hemorrhoids   . Polycystic kidney disease     Stage 4 per patient  . GERD (gastroesophageal reflux disease)   . Hypertension   . Lichen planus    Social History   Social History  . Marital Status: Widowed    Spouse Name: N/A  . Number of Children: 1  . Years of Education: N/A   Occupational History  .  Retired    Social History Main Topics  . Smoking status: Never Smoker   . Smokeless tobacco: Never Used  . Alcohol Use: Yes     Comment: occ.  . Drug Use: No  . Sexual Activity: Not Asked   Other Topics Concern  . None   Social History Narrative   Caffeine 3 cups of coffee a day   Widowed, lives alone   Has one son-locally   Originally from [place]-came to [place] in 1987   Retired from office work   Has cats   Family History  Problem Relation Age of Onset  . Breast cancer Sister   . Heart disease Mother   . Kidney disease Mother   . Colon cancer Neg Hx   . Kidney disease Father   . Heart disease Father    Scheduled Meds: . famotidine (PEPCID) IV  20 mg Intravenous Q12H  . loratadine  10 mg Oral Daily  . LORazepam  0.5 mg Oral QHS  . metronidazole  500 mg Intravenous Q8H  . nystatin  5 mL Oral QID  . ondansetron (ZOFRAN) IV  4 mg Intravenous 4 times per day  . oxymetazoline  1 spray Each Nare BID  . scopolamine  1 patch Transdermal Q72H  . sodium chloride flush  3 mL Intravenous Q12H  . Tbo-Filgrastim  300 mcg Subcutaneous Daily  . vancomycin  500 mg Oral 4 times per day   Continuous Infusions: . sodium chloride 100 mL/hr at 09/20/15 0153   PRN Meds:.acetaminophen **OR** acetaminophen, alum & mag hydroxide-simeth, HYDROcodone-acetaminophen, LORazepam, morphine injection, promethazine, sodium chloride flush, traMADol Medications Prior to Admission:  Prior to Admission medications   Medication Sig Start Date End Date Taking? Authorizing Provider  alendronate (FOSAMAX) 70 MG tablet Take 70 mg by mouth once a week. Take with a full glass of water on an empty stomach.   Yes Historical Provider, MD  amLODipine (NORVASC) 5 MG tablet Take 5 mg by mouth daily.   Yes Historical Provider, MD  bisacodyl (DULCOLAX) 5 MG EC tablet Take 5 mg by mouth as needed for moderate constipation.   Yes Historical Provider, MD  HYDROcodone-acetaminophen (NORCO/VICODIN) 5-325 MG tablet Take 1 tablet  by mouth every 4 (four) hours as needed for moderate pain. 09/06/15  Yes Ladell Pier, MD  lidocaine-prilocaine (EMLA) cream Apply small amount over port area 1 hour prior to treatment and cover with plastic wrap.  DO NOT RUB IN 09/06/15  Yes Ladell Pier, MD  loratadine (CLARITIN) 10 MG tablet Take 10 mg by mouth daily.   Yes Historical Provider, MD  LORazepam (ATIVAN) 0.5 MG tablet Take 1 tablet (0.5 mg total) by mouth at bedtime. 09/06/15  Yes Ladell Pier, MD  omeprazole (PRILOSEC) 40 MG capsule Take 40 mg by mouth daily as needed (acid reflux).  08/05/15  Yes Historical Provider, MD  ondansetron (ZOFRAN) 4 MG tablet Take 1 tablet (4 mg total) by mouth every 6 (six) hours. 08/27/15  Yes Jeffrey Hedges, PA-C  traMADol (ULTRAM) 50 MG tablet Take  1 tablet (50 mg total) by mouth 3 (three) times daily as needed for moderate pain. 08/29/15  Yes Ladene Artist, MD   No Known Allergies  Review of Systems  Constitutional: Positive for activity change, appetite change and unexpected weight change.  HENT: Negative.   Eyes: Negative.   Respiratory: Negative.   Cardiovascular: Negative.   Gastrointestinal: Positive for nausea and vomiting.  Endocrine: Negative.   Genitourinary: Positive for frequency.  Musculoskeletal: Negative.   Skin: Negative.   Allergic/Immunologic: Negative.   Neurological: Positive for weakness.  Hematological: Negative.   Psychiatric/Behavioral: Negative.      Physical Exam  Extremely pleasant elderly female, NAD, A&O CV:  RRR no m/r/g Resp:  CTA no w/c/r Abdomen:  Soft, Nt, Nd, +Bs Extremities:  No swelling, able to move all four Skin:  Small annular very tender area on  lateral plantar area of left foot  Vital Signs: BP 120/62 mmHg  Pulse 77  Temp(Src) 97.9 F (36.6 C) (Oral)  Resp 18  Ht 5\' 3"  (1.6 m)  Wt 69.9 kg (154 lb 1.6 oz)  BMI 27.30 kg/m2  SpO2 99%  SpO2: SpO2: 99 % O2 Device:SpO2: 99 % O2 Flow Rate: .   IO: Intake/output summary:    Intake/Output Summary (Last 24 hours) at 09/20/15 0827 Last data filed at 09/19/15 1854  Gross per 24 hour  Intake   1905 ml  Output      0 ml  Net   1905 ml    LBM: Last BM Date: 09/18/15 Baseline Weight: Weight: 62.959 kg (138 lb 12.8 oz) Most recent weight: Weight: 69.9 kg (154 lb 1.6 oz)      Palliative Assessment/Data:  Flowsheet Rows        Most Recent Value   Intake Tab    Referral Department  Hospitalist   Unit at Time of Referral  Med/Surg Unit   Palliative Care Primary Diagnosis  Cancer   Date Notified  09/19/15   Palliative Care Type  New Palliative care   Reason for referral  Clarify Goals of Care   Date of Admission  09/13/15   Date first seen by Palliative Care  09/20/15   # of days Palliative referral response time  1 Day(s)   # of days IP prior to Palliative referral  6   Clinical Assessment    Palliative Performance Scale Score  50%   Other Max Last 24 Hours  8   Psychosocial & Spiritual Assessment    Palliative Care Outcomes    Patient/Family meeting held?  Yes   Who was at the meeting?  Son Erica Simpson, and patient   Palliative Care Outcomes  Improved non-pain symptom therapy, Clarified goals of care, Changed to focus on comfort   Patient/Family wishes: Interventions discontinued/not started   Mechanical Ventilation, Vasopressors, BiPAP, Antibiotics, PEG      Additional Data Reviewed:  CBC:    Component Value Date/Time   WBC 1.6* 09/20/2015 0450   WBC 3.7* 09/13/2015 1430   HGB 7.6* 09/20/2015 0450   HGB 9.0* 09/13/2015 1430   HCT 23.0* 09/20/2015 0450   HCT 28.0* 09/13/2015 1430   PLT 98* 09/20/2015 0450   PLT 310 09/13/2015 1430   MCV 85.8 09/20/2015 0450   MCV 83.5 09/13/2015 1430   NEUTROABS 0.8* 09/20/2015 0450   NEUTROABS 2.9 09/13/2015 1430   LYMPHSABS 0.4* 09/20/2015 0450   LYMPHSABS 0.7* 09/13/2015 1430   MONOABS 0.3 09/20/2015 0450   MONOABS 0.1 09/13/2015 1430  EOSABS 0.1 09/20/2015 0450   EOSABS 0.0 09/13/2015 1430    BASOSABS 0.0 09/20/2015 0450   BASOSABS 0.0 09/13/2015 1430   Comprehensive Metabolic Panel:    Component Value Date/Time   NA 139 09/19/2015 0534   NA 138 09/13/2015 1430   K 3.8 09/19/2015 0534   K 5.6 Repeated and Verified, no hemolysis* 09/13/2015 1430   CL 118* 09/19/2015 0534   CO2 13* 09/19/2015 0534   CO2 21* 09/13/2015 1430   BUN 43* 09/19/2015 0534   BUN 69.0* 09/13/2015 1430   CREATININE 2.93* 09/19/2015 0534   CREATININE 3.0* 09/13/2015 1430   GLUCOSE 99 09/19/2015 0534   GLUCOSE 120 09/13/2015 1430   CALCIUM 7.6* 09/19/2015 0534   CALCIUM 10.2 09/13/2015 1430   AST 19 09/19/2015 0534   AST 67* 09/13/2015 1430   ALT 23 09/19/2015 0534   ALT 68* 09/13/2015 1430   ALKPHOS 128* 09/19/2015 0534   ALKPHOS 400* 09/13/2015 1430   BILITOT 0.9 09/19/2015 0534   BILITOT 0.66 09/13/2015 1430   PROT 4.4* 09/19/2015 0534   PROT 7.0 09/13/2015 1430   ALBUMIN 1.8* 09/19/2015 0534   ALBUMIN 2.7* 09/13/2015 1430     Time In: 7:30 Time Out: 8:44 Time Total: 74 min Greater than 50%  of this time was spent counseling and coordinating care related to the above assessment and plan.  Signed by:  Imogene Burn, PA-C Palliative Medicine Pager: [phone]  09/20/2015, 8:27 AM  Please contact Palliative Medicine Team phone at [phone] for questions and concerns.

## 2015-09-20 NOTE — Care Management Important Message (Signed)
Important Message  Patient Details IM Letter given to Kathy/Case Manager to present to Patient Name: Erica Simpson MRN: [id number] Date of Birth: Aug 01, 1935   Medicare Important Message Given:  Yes    Camillo Flaming 09/20/2015, 12:44 [place] Message  Patient Details  Name: Erica Simpson MRN: [id number] Date of Birth: January 19, 1936   Medicare Important Message Given:  Yes    Camillo Flaming 09/20/2015, 12:43 PM

## 2015-09-20 NOTE — Consult Note (Signed)
WOC wound consult note Reason for Consult: Area causing pain on ambulation on plantar aspect of left foot at 5th metatarsal head Wound type: infectious, viral Pressure Ulcer POA: No Measurement:0.5cm round x 0.2cm depth with 0.2cm callous surrounding Wound [id number] pink, dry Drainage (amount, consistency, odor) none Periwound:intact Dressing procedure/placement/frequency: The presentation of this skin manifestation is consistent with that of a Plantar Ulceration or Wart, viral etiology.  Suggest referral to a podiatrist following discharge for cryotherapy or topical salicytic acid pad placement for resolution. [facility] nursing team will not follow, but will remain available to this patient, the nursing and medical teams.  Please re-consult if needed. Thanks, Maudie Flakes, MSN, RN, [place], Arther Abbott  Pager# [phone]

## 2015-09-20 NOTE — Progress Notes (Signed)
PROGRESS NOTE  Erica Simpson [id number] DOB: [birth year] DOA: 09/13/2015 PCP: Gennette Pac, MD  Erica Simpson is a 80 y.o. female with Past medical history of polycystic kidney disease, chronic kidney disease stage III,. 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 Patient was recently started on chemotherapy she had her first cycle completed on 09/08/2015.  In the hospital found to have c diff and ecoli bacteremia.  Slowly    Assessment/Plan: Acute-on-chronic kidney injury ([place]) -baseline: 2.5 -increase IVF  N/V/diarrhea due to C diff colitis- patient had diarrhea upon admission but was initally attributed to chemo, known to cause diarrhea but when did not improve, c diff was ordered and was positive -IV flagyl and PO vanc (not sure if patient will be able to take in PO with vomiting) ?if need for CT scan vs small bowel to r/o partial obstruction- tolerating soft/liquid diet  x-ray of her abdomen ok  essential hypertension and presentation with hypotension. Hold home meds  Adenocarcinoma involving a duodenal mass  CT 08/27/2015 consistent with a duodenal mass/uncinate mass, liver metastases, pulmonary metastases, and metastatic lymphadenopathy  Cycle 1 FOLFIRI 09/08/2015 -Management per oncology. -poor overall prognosis- palliative care saw  Pancytopenia- side affect of chemo -started on G-CSF -blood cultures- gram neg rods -started IV abx- will narrow and repeat blood cultures -s/p 2 units PRBC -flu negative  ecoli bacteremia IV abx -repeat BC pending    Mobilize out of bed to chair   Code Status: full Family Communication: patient Disposition Plan: eventual SNF suspect later this week   Consultants:  Oncology  GI  Procedures:      HPI/Subjective: Able to tolerate food better this AM- no vomiting  Objective: Filed Vitals:   09/20/15 0601 09/20/15 1325  BP:  120/62 127/69  Pulse: 77 87  Temp: 97.9 F (36.6 C) 98.1 F (36.7 C)  Resp: 18 16    Intake/Output Summary (Last 24 hours) at 09/20/15 1646 Last data filed at 09/20/15 1500  Gross per 24 hour  Intake   1640 ml  Output      0 ml  Net   1640 ml   Filed Weights   09/18/15 0526 09/19/15 0627 09/20/15 0601  Weight: 70.4 kg (155 lb 3.3 oz) 70.217 kg (154 lb 12.8 oz) 69.9 kg (154 lb 1.6 oz)    Exam:   General:  Less distress today  Cardiovascular: rrr  Respiratory: clear  Abdomen: +Bs, tender with deep palpation  Musculoskeletal: no edema  Data Reviewed: Basic Metabolic Panel:  Recent Labs Lab 09/13/15 1830 09/14/15 0645 09/15/15 0526 09/16/15 0420 09/18/15 0436 09/19/15 0534  NA 136 142 141 139 139 139  K 4.8 5.0 4.5 4.4 4.0 3.8  CL 103 113* 114* 112* 117* 118*  CO2 21* 20* 18* 17* 14* 13*  GLUCOSE 131* 98 106* 121* 97 99  BUN 72* 58* 46* 39* 46* 43*  CREATININE 2.92* 2.84* 2.59* 2.68* 3.13* 2.93*  CALCIUM 9.3 8.1* 7.9* 7.8* 7.4* 7.6*  MG 2.4  --   --   --   --   --    Liver Function Tests:  Recent Labs Lab 09/13/15 1830 09/14/15 0645 09/19/15 0534  AST 56* 46* 19  ALT 56* 43 23  ALKPHOS 304* 244* 128*  BILITOT 0.6 0.7 0.9  PROT 6.1* 5.2* 4.4*  ALBUMIN 2.5* 2.2* 1.8*   No results for input(s): LIPASE, AMYLASE in the last 168 hours. No results for  input(s): AMMONIA in the last 168 hours. CBC:  Recent Labs Lab 09/16/15 0420 09/17/15 0348 09/18/15 0436 09/19/15 0534 09/20/15 0450  WBC 0.3* 0.3* 0.3* 0.6* 1.6*  NEUTROABS 0.0* 0.0* 0.0* 0.2* 0.8*  HGB 9.5* 8.6* 7.8* 8.0* 7.6*  HCT 28.9* 26.2* 23.9* 24.1* 23.0*  MCV 87.0 86.8 86.9 86.1 85.8  PLT 116* 108* 84* 101* 98*   Cardiac Enzymes: No results for input(s): CKTOTAL, CKMB, CKMBINDEX, TROPONINI in the last 168 hours. BNP (last 3 results) No results for input(s): BNP in the last 8760 hours.  ProBNP (last 3 results) No results for input(s): PROBNP in the last 8760 hours.  CBG: No  results for input(s): GLUCAP in the last 168 hours.  Recent Results (from the past 240 hour(s))  Culture, blood (Routine X 2) w Reflex to ID Panel     Status: None   Collection Time: 09/16/15  8:48 AM  Result Value Ref Range Status   Specimen Description BLOOD BLOOD RIGHT WRIST  Final   Special Requests BOTTLES DRAWN AEROBIC AND ANAEROBIC 10CC  Final   Culture  Setup Time   Final    GRAM NEGATIVE RODS IN BOTH AEROBIC AND ANAEROBIC BOTTLES CRITICAL RESULT CALLED TO, READ BACK BY AND VERIFIED WITHHewitt Shorts RN 2327 09/16/15 A BROWNING    Culture   Final    ESCHERICHIA COLI Performed at [facility]    Report Status 09/18/2015 FINAL  Final   Organism ID, Bacteria ESCHERICHIA COLI  Final      Susceptibility   Escherichia coli - MIC*    AMPICILLIN <=2 SENSITIVE Sensitive     CEFAZOLIN <=4 SENSITIVE Sensitive     CEFEPIME <=1 SENSITIVE Sensitive     CEFTAZIDIME <=1 SENSITIVE Sensitive     CEFTRIAXONE <=1 SENSITIVE Sensitive     CIPROFLOXACIN <=0.25 SENSITIVE Sensitive     GENTAMICIN <=1 SENSITIVE Sensitive     IMIPENEM <=0.25 SENSITIVE Sensitive     TRIMETH/SULFA <=20 SENSITIVE Sensitive     AMPICILLIN/SULBACTAM <=2 SENSITIVE Sensitive     PIP/TAZO <=4 SENSITIVE Sensitive     * ESCHERICHIA COLI  Culture, blood (Routine X 2) w Reflex to ID Panel     Status: None   Collection Time: 09/16/15  8:48 AM  Result Value Ref Range Status   Specimen Description BLOOD RIGHT ARM  Final   Special Requests BOTTLES DRAWN AEROBIC AND ANAEROBIC 10CC  Final   Culture  Setup Time   Final    GRAM NEGATIVE RODS IN BOTH AEROBIC AND ANAEROBIC BOTTLES CRITICAL RESULT CALLED TO, READ BACK BY AND VERIFIED WITHHewitt Shorts RN 2327 09/16/15 A BROWNING    Culture   Final    ESCHERICHIA COLI SUSCEPTIBILITIES PERFORMED ON PREVIOUS CULTURE WITHIN THE LAST 5 DAYS. Performed at [facility]    Report Status 09/18/2015 FINAL  Final  Urine culture     Status: None   Collection Time: 09/16/15  9:55  AM  Result Value Ref Range Status   Specimen Description URINE, CLEAN CATCH  Final   Special Requests NONE  Final   Culture   Final    MULTIPLE SPECIES PRESENT, SUGGEST RECOLLECTION Performed at [facility]    Report Status 09/17/2015 FINAL  Final  Gastrointestinal Panel by PCR , Stool     Status: None   Collection Time: 09/16/15  5:48 PM  Result Value Ref Range Status   Campylobacter species NOT DETECTED NOT DETECTED Final   Plesimonas shigelloides NOT DETECTED NOT  DETECTED Final   Salmonella species NOT DETECTED NOT DETECTED Final   Yersinia enterocolitica NOT DETECTED NOT DETECTED Final   Vibrio species NOT DETECTED NOT DETECTED Final   Vibrio cholerae NOT DETECTED NOT DETECTED Final   Enteroaggregative E coli (EAEC) NOT DETECTED NOT DETECTED Final   Enteropathogenic E coli (EPEC) NOT DETECTED NOT DETECTED Final   Enterotoxigenic E coli (ETEC) NOT DETECTED NOT DETECTED Final   Shiga like toxin producing E coli (STEC) NOT DETECTED NOT DETECTED Final   E. coli O157 NOT DETECTED NOT DETECTED Final   Shigella/Enteroinvasive E coli (EIEC) NOT DETECTED NOT DETECTED Final   Cryptosporidium NOT DETECTED NOT DETECTED Final   Cyclospora cayetanensis NOT DETECTED NOT DETECTED Final   Entamoeba histolytica NOT DETECTED NOT DETECTED Final   Giardia lamblia NOT DETECTED NOT DETECTED Final   Adenovirus F40/41 NOT DETECTED NOT DETECTED Final   Astrovirus NOT DETECTED NOT DETECTED Final   Norovirus GI/GII NOT DETECTED NOT DETECTED Final   Rotavirus A NOT DETECTED NOT DETECTED Final   Sapovirus (I, II, IV, and V) NOT DETECTED NOT DETECTED Final  C difficile quick scan w PCR reflex     Status: Abnormal   Collection Time: 09/16/15  5:48 PM  Result Value Ref Range Status   C Diff antigen POSITIVE (A) NEGATIVE Final   C Diff toxin POSITIVE (A) NEGATIVE Final   C Diff interpretation Positive for toxigenic C. difficile  Final    Comment: CRITICAL RESULT CALLED TO, READ BACK BY AND VERIFIED  WITH: Britta Mccreedy [phone]:[id number] @ 1902 BY J SCOTTON   Culture, blood (routine x 2)     Status: None (Preliminary result)   Collection Time: 09/19/15  5:34 AM  Result Value Ref Range Status   Specimen Description BLOOD RIGHT ARM  Final   Special Requests BOTTLES DRAWN AEROBIC AND ANAEROBIC 6CC  Final   Culture   Final    NO GROWTH 1 DAY Performed at [facility]    Report Status PENDING  Incomplete  Culture, blood (routine x 2)     Status: None (Preliminary result)   Collection Time: 09/19/15  5:34 AM  Result Value Ref Range Status   Specimen Description BLOOD LEFT ARM  Final   Special Requests BOTTLES DRAWN AEROBIC AND ANAEROBIC [place]  Final   Culture   Final    NO GROWTH 1 DAY Performed at [facility]    Report Status PENDING  Incomplete     Studies: No results found.  Scheduled Meds: . famotidine (PEPCID) IV  20 mg Intravenous Q12H  . loratadine  10 mg Oral Daily  . LORazepam  0.5 mg Oral QHS  . metronidazole  500 mg Intravenous Q8H  . ondansetron (ZOFRAN) IV  4 mg Intravenous 4 times per day  . oxymetazoline  1 spray Each Nare BID  . scopolamine  1 patch Transdermal Q72H  . sodium chloride flush  3 mL Intravenous Q12H  . Tbo-Filgrastim  300 mcg Subcutaneous Daily  . vancomycin  500 mg Oral 4 times per day   Continuous Infusions: . sodium chloride 100 mL/hr at 09/20/15 1321   Antibiotics Given (last 72 hours)    Date/Time Action Medication Dose Rate   09/17/15 1744 Given   vancomycin (VANCOCIN) 50 mg/mL oral solution 500 mg 500 mg    09/17/15 1744 Given   metroNIDAZOLE (FLAGYL) IVPB 500 mg 500 mg 100 mL/hr   09/18/15 0032 Given   vancomycin (VANCOCIN) 50 mg/mL oral solution 500 mg  500 mg    09/18/15 0100 Given   metroNIDAZOLE (FLAGYL) IVPB 500 mg 500 mg 100 mL/hr   09/18/15 0535 Given   vancomycin (VANCOCIN) 50 mg/mL oral solution 500 mg 500 mg    09/18/15 0902 Given   ceFEPIme (MAXIPIME) 2 g in dextrose 5 % 50 mL IVPB 2 g 100 mL/hr   09/18/15  1011 Given   metroNIDAZOLE (FLAGYL) IVPB 500 mg 500 mg 100 mL/hr   09/18/15 1154 Given   vancomycin (VANCOCIN) 50 mg/mL oral solution 500 mg 500 mg    09/18/15 1758 Given   vancomycin (VANCOCIN) 50 mg/mL oral solution 500 mg 500 mg    09/18/15 1758 Given   metroNIDAZOLE (FLAGYL) IVPB 500 mg 500 mg 100 mL/hr   09/19/15 0052 Given   vancomycin (VANCOCIN) 50 mg/mL oral solution 500 mg 500 mg    09/19/15 0200 Given   metroNIDAZOLE (FLAGYL) IVPB 500 mg 500 mg 100 mL/hr   09/19/15 [id number] Given   vancomycin (VANCOCIN) 50 mg/mL oral solution 500 mg 500 mg    09/19/15 0846 Given   ceFEPIme (MAXIPIME) 2 g in dextrose 5 % 50 mL IVPB 2 g 100 mL/hr   09/19/15 1002 Given   metroNIDAZOLE (FLAGYL) IVPB 500 mg 500 mg 100 mL/hr   09/19/15 1237 Given   vancomycin (VANCOCIN) 50 mg/mL oral solution 500 mg 500 mg    09/19/15 1752 Given   vancomycin (VANCOCIN) 50 mg/mL oral solution 500 mg 500 mg    09/19/15 1752 Given   metroNIDAZOLE (FLAGYL) IVPB 500 mg 500 mg 100 mL/hr   09/19/15 2213 Given   ceFAZolin (ANCEF) IVPB 1 g/50 mL premix 1 g 100 mL/hr   09/20/15 0028 Given   vancomycin (VANCOCIN) 50 mg/mL oral solution 500 mg 500 mg    09/20/15 0154 Given   metroNIDAZOLE (FLAGYL) IVPB 500 mg 500 mg 100 mL/hr   09/20/15 0654 Given   vancomycin (VANCOCIN) 50 mg/mL oral solution 500 mg 500 mg    09/20/15 0919 Given   metroNIDAZOLE (FLAGYL) IVPB 500 mg 500 mg 100 mL/hr   09/20/15 1154 Given   vancomycin (VANCOCIN) 50 mg/mL oral solution 500 mg 500 mg       Principal Problem:   Acute-on-chronic kidney injury ([place]) Active Problems:   GERD   Small bowel cancer (HCC)   Hypotension   Dehydration   Hyperkalemia   CKD (chronic kidney disease) stage 3, GFR 30-59 ml/min   Diarrhea   Polycystic kidney disease   Chemotherapy-induced neutropenia ([facility])   Intractable nausea and vomiting   Fever, unspecified   Vomiting   Enteritis due to Clostridium difficile   Bacteremia   E coli bacteremia    Gastroesophageal reflux disease without esophagitis   Palliative care encounter   DNR (do not resuscitate)   Goals of care, counseling/discussion    Time spent: 25 min    [facility] Pager [phone] If 7PM-7AM, please contact night-coverage at [email or web address], password [facility] 09/20/2015, 4:46 PM  LOS: 7 days

## 2015-09-20 NOTE — Telephone Encounter (Signed)
cld pt and left message to adv appt on 3/10-adv all other appts had been cx-adv 3/10 @ 9

## 2015-09-20 NOTE — Progress Notes (Signed)
IP PROGRESS NOTE  Subjective:  She reports reflux symptoms. She was out of bed yesterday. She would like to go to a skilled nursing facility at discharge. She has decided against further chemotherapy. No diarrhea.   Objective: Vital signs in last 24 hours: Blood pressure 120/62, pulse 77, temperature 97.9 F (36.6 C), temperature source Oral, resp. rate 18, height 5\' 3"  (1.6 m), weight 154 lb 1.6 oz (69.9 kg), SpO2 99 %.  Intake/Output from previous day: 02/27 0701 - 02/28 0700 In: 1905 [P.O.:240; I.V.:1365; IV Piggyback:300] Out: -   Physical Exam:  HEENT: No thrush  Abdomen: Mildly distended, nontender Extremities: No leg edema   Portacath/PICC-without erythema  Lab Results:  Recent Labs  09/19/15 0534 09/20/15 0450  WBC 0.6* 1.6*  HGB 8.0* 7.6*  HCT 24.1* 23.0*  PLT 101* 98*   ANC-0.8  BMET  Recent Labs  09/18/15 0436 09/19/15 0534  NA 139 139  K 4.0 3.8  CL 117* 118*  CO2 14* 13*  GLUCOSE 97 99  BUN 46* 43*  CREATININE 3.13* 2.93*  CALCIUM 7.4* 7.6*    Studies/Results: No results found.  Medications: I have reviewed the patient's current medications.  Assessment/Plan: 1. Adenocarcinoma involving a duodenal mass  CT 08/27/2015 consistent with a duodenal mass/uncinate mass, liver metastases, pulmonary metastases, and metastatic lymphadenopathy  Cycle 1 FOLFIRI 09/08/2015  2. Anorexia/weight loss  3.Nausea/vomiting-most likely secondary to the duodenal mass,? Obstruction  4. Polycystic kidney disease/renal failure  5. Anemia secondary to renal failure, bleeding from the duodenal tumor, and chemotherapy, status post a red cell transfusion 09/14/2015  6.   Neutropenia/thrombocytopenia secondary to chemotherapy-G-CSF started 09/14/2015  7.   History of diarrhea secondary chemotherapy  8.   Escherichia coli bacteremia  9.   C. difficile colitis-Flagyl started 09/17/2015  Ms. Coltharp has an improved performance status. She would  like to go to a skilled nursing facility for physical therapy at discharge. Her goal is to return home with Hospice care. I discussed the situation with Ms. Hellinger and her son. She does not wish to consider further chemotherapy. I will see her back in the office after the skilled nursing stay and initiate home hospice care.    Recommendations: 1. Continue G-CSF, daily CBC, stop G-CSF if the [place] is greater than 1000 tomorrow 2. Continue cefepime for treatment of the Escherichia coli bacteremia, completed a course of oral antibiotics at discharge 3. Flagyl for C. difficile colitis 4. Proceed with plan for skilled nursing facility placement at discharge, outpatient follow-up at the Cancer center to arrange for home Hospice care.    LOS: 7 days   Betsy Coder, MD   09/20/2015, 8:49 AM

## 2015-09-20 NOTE — Progress Notes (Signed)
Per Dr. Benay Spice: Pt will be discharged to SNF then likely to hospice. She declines further chemo. Will need office visit 3/10. Orders sent to schedulers.

## 2015-09-20 NOTE — Care Management Note (Signed)
Case Management Note  Patient Details  Name: Erica Simpson MRN: [id number] Date of Birth: 11/13/1935  Subjective/Objective:                    Action/Plan:d/c plan SNF w/PCS.   Expected Discharge Date:                  Expected Discharge Plan:  Skilled Nursing Facility  In-House Referral:  Clinical Social Work  Discharge planning Services  CM Consult  Post Acute Care Choice:    Choice offered to:     DME Arranged:    DME Agency:     HH Arranged:    [place] Agency:     Status of Service:  In process, will continue to follow  Medicare Important Message Given:  Yes Date Medicare IM Given:    Medicare IM give by:    Date Additional Medicare IM Given:    Additional Medicare Important Message give by:     If discussed at [place] of Stay Meetings, dates discussed:    Additional Comments:  Dessa Phi, RN 09/20/2015, 1:47 PM

## 2015-09-21 ENCOUNTER — Telehealth: Payer: Self-pay | Admitting: Oncology

## 2015-09-21 ENCOUNTER — Telehealth: Payer: Self-pay | Admitting: *Deleted

## 2015-09-21 DIAGNOSIS — M549 Dorsalgia, unspecified: Secondary | ICD-10-CM

## 2015-09-21 DIAGNOSIS — C179 Malignant neoplasm of small intestine, unspecified: Secondary | ICD-10-CM

## 2015-09-21 LAB — CBC WITH DIFFERENTIAL/PLATELET
BASOS PCT: 0 %
Basophils Absolute: 0 10*3/uL (ref 0.0–0.1)
EOS ABS: 0.1 10*3/uL (ref 0.0–0.7)
EOS PCT: 3 %
HCT: 22.3 % — ABNORMAL LOW (ref 36.0–46.0)
HEMOGLOBIN: 7.5 g/dL — AB (ref 12.0–15.0)
Lymphocytes Relative: 18 %
Lymphs Abs: 0.7 10*3/uL (ref 0.7–4.0)
MCH: 28.8 pg (ref 26.0–34.0)
MCHC: 33.6 g/dL (ref 30.0–36.0)
MCV: 85.8 fL (ref 78.0–100.0)
MONO ABS: 0.9 10*3/uL (ref 0.1–1.0)
Monocytes Relative: 24 %
NEUTROS ABS: 2.2 10*3/uL (ref 1.7–7.7)
NEUTROS PCT: 55 %
PLATELETS: 92 10*3/uL — AB (ref 150–400)
RBC: 2.6 MIL/uL — ABNORMAL LOW (ref 3.87–5.11)
RDW: 15.6 % — AB (ref 11.5–15.5)
WBC: 3.9 10*3/uL — ABNORMAL LOW (ref 4.0–10.5)

## 2015-09-21 MED ORDER — DEXTROSE 5 % IV SOLN
2.0000 g | INTRAVENOUS | Status: DC
  Administered 2015-09-21 – 2015-09-23 (×3): 2 g via INTRAVENOUS
  Filled 2015-09-21 (×3): qty 2

## 2015-09-21 NOTE — Progress Notes (Signed)
PT Cancellation Note  Patient Details Name: Erica Simpson MRN: [id number] DOB: [birth year]   Cancelled Treatment:    Reason Eval/Treat Not Completed: Fatigue/lethargy limiting ability to participate Observed pt ambulating with family this morning.  Pt reports she has been tired and sleeping since then.  Pt states she plans to d/c to [facility] 09/21/2015, 3:33 PM Carmelia Bake, PT, DPT 09/21/2015 Pager: [phone]

## 2015-09-21 NOTE — Telephone Encounter (Signed)
Call from pt's son asking, "What is the plan for today?" Wants to know specifically when she will be discharged. He reports they have decided on SNF placement at [facility]. Per Dr. Benay Spice: Counts have recovered, G-CSF stopped. OK for DC on oral antibiotics.  [name] asks if pt needs transfusion. Reviewed with Dr. Benay Spice: Will check labs at office visit and decide on transfusion then.  Karel Jarvis with this info, he asks if he needs to call [facility] to let them know she may need the bed today or tomorrow. Informed him case manager should be able to assist with that. Order to schedulers to add lab to office visit.

## 2015-09-21 NOTE — Telephone Encounter (Signed)
Left message to inform patient of added lab appt 3/10 per 3/1 pof

## 2015-09-21 NOTE — Progress Notes (Signed)
IP PROGRESS NOTE  Subjective:  The diarrhea has improved. She was out of bed and ambulating yesterday. She has back pain.   Objective: Vital signs in last 24 hours: Blood pressure 133/62, pulse 98, temperature 97.6 F (36.4 C), temperature source Oral, resp. rate 20, height 5\' 3"  (1.6 m), weight 156 lb 15.5 oz (71.2 kg), SpO2 96 %.  Intake/Output from previous day: 02/28 0701 - 03/01 0700 In: 2590 [P.O.:240; I.V.:2300; IV Piggyback:50] Out: -   Physical Exam:  HEENT: No thrush  Abdomen: Mildly distended, nontender Extremities: No leg edema Musculoskeletal: No spine tenderness  Portacath/PICC-without erythema  Lab Results:  Recent Labs  09/20/15 0450 09/21/15 0350  WBC 1.6* 3.9*  HGB 7.6* 7.5*  HCT 23.0* 22.3*  PLT 98* 92*   ANC-2.2  BMET  Recent Labs  09/19/15 0534  NA 139  K 3.8  CL 118*  CO2 13*  GLUCOSE 99  BUN 43*  CREATININE 2.93*  CALCIUM 7.6*    Studies/Results: No results found.  Medications: I have reviewed the patient's current medications.  Assessment/Plan: 1. Adenocarcinoma involving a duodenal mass  CT 08/27/2015 consistent with a duodenal mass/uncinate mass, liver metastases, pulmonary metastases, and metastatic lymphadenopathy  Cycle 1 FOLFIRI 09/08/2015  2. Anorexia/weight loss  3.Nausea/vomiting-most likely secondary to the duodenal mass or C. difficile colitis-improved  4. Polycystic kidney disease/renal failure  5. Anemia secondary to renal failure, bleeding from the duodenal tumor, and chemotherapy, status post a red cell transfusion 09/14/2015  6.   Neutropenia/thrombocytopenia secondary to chemotherapy-the neutrophil count has recovered  7.   History of diarrhea secondary chemotherapy  8.   Escherichia coli bacteremia  9.   C. difficile colitis-Flagyl started 09/17/2015  Ms. Woerner has improved over the past several days. She is now tolerating a diet and the diarrhea has improved. She can be discharged  from an oncology standpoint. She can complete an outpatient course of oral antibiotics for the Escherichia coli bacteremia and C. difficile colitis.  Ms. Guse and her son will decide on home with Hospice care and sitters, versus skilled nursing facility placement. We will schedule outpatient follow-up at the [facility] in approximately one week.    Recommendations: 1. Discontinue G-CSF 2. Continue antibiotics for C. difficile colitis and Escherichia coli bacteremia 3. Skilled nursing facility placement versus home with Hospice care 4. Outpatient follow-up at the Cancer center for the week of 09/26/2015    LOS: 8 days   Betsy Coder, MD   09/21/2015, 8:41 AM

## 2015-09-21 NOTE — Progress Notes (Signed)
Patient has a bed at [facility]. CSW has completed FL2 & will continue to follow and assist with discharge when ready.    Raynaldo Opitz, [facility] Clinical Social Worker cell #: [phone]

## 2015-09-21 NOTE — Progress Notes (Signed)
[facility] PROGRESS NOTE  Erica Simpson [id number] DOB: December 23, 1935 DOA: 09/13/2015 PCP: Erica Pac, MD  Summary [facility]. I have seen and examined Erica Simpson at bedside, reviewed her chart and discussed with Dr. Benay Simpson. I also spoke with her son Erica Simpson over the phone. Erica Simpson is a 80 y.o. female with Past medical history of polycystic kidney disease, chronic kidney disease stage III,. 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 Patient was recently started on chemotherapy she had her first cycle completed on 09/08/2015. In the hospital found to have c diff and ecoli bacteremia. She reports that she feels better but diarrhea is persistent. She should complete at least 7-10 days of antibiotics for Escherichia coli bacteremia. We'll therefore resume ceftriaxone, continue Flagyl/oral vancomycin and repeat blood cultures. She will likely discharge to skilled nursing facility in the next 24-48 hours. Plan Escherichia coli bacteremia  Repeat blood cultures  Ceftriaxone for 5 more days C. difficile colitis  On Flagyl/oral vancomycin to complete 2 weeks of antibiotics Small bowel cancer  Defer management to oncology  Code Status: DNR Family Communication: Spoke with son Erica Simpson over the phone. Disposition Plan: ?SNF Friday   Consultants:  Oncology  Procedures:    Antibiotics:  Oral vancomycin  Flagyl  Ceftriaxone  HPI/Subjective: Feels better, still has diarrhea.  Objective: Filed Vitals:   09/20/15 1325 09/21/15 0512  BP: 127/69 133/62  Pulse: 87 98  Temp: 98.1 F (36.7 C) 97.6 F (36.4 C)  Resp: 16 20    Intake/Output Summary (Last 24 hours) at 09/21/15 2016 Last data filed at 09/21/15 1830  Gross per 24 hour  Intake   2450 ml  Output      0 ml  Net   2450 ml   Filed Weights   09/19/15 0627 09/20/15 0601 09/21/15 0512  Weight: 70.217 kg (154 lb 12.8  oz) 69.9 kg (154 lb 1.6 oz) 71.2 kg (156 lb 15.5 oz)    Exam:   General:  Comfortable at rest.  Cardiovascular: S1-S2 normal. No murmurs. Pulse regular.  Respiratory: Good air entry bilaterally. No rhonchi or rales.  Abdomen: Soft and nontender. Normal bowel sounds. No organomegaly.  Musculoskeletal: No pedal edema   Neurological: Intact  Data Reviewed: Basic Metabolic Panel:  Recent Labs Lab 09/15/15 0526 09/16/15 0420 09/18/15 0436 09/19/15 0534  NA 141 139 139 139  K 4.5 4.4 4.0 3.8  CL 114* 112* 117* 118*  CO2 18* 17* 14* 13*  GLUCOSE 106* 121* 97 99  BUN 46* 39* 46* 43*  CREATININE 2.59* 2.68* 3.13* 2.93*  CALCIUM 7.9* 7.8* 7.4* 7.6*   Liver Function Tests:  Recent Labs Lab 09/19/15 0534  AST 19  ALT 23  ALKPHOS 128*  BILITOT 0.9  PROT 4.4*  ALBUMIN 1.8*   No results for input(s): LIPASE, AMYLASE in the last 168 hours. No results for input(s): AMMONIA in the last 168 hours. CBC:  Recent Labs Lab 09/17/15 0348 09/18/15 0436 09/19/15 0534 09/20/15 0450 09/21/15 0350  WBC 0.3* 0.3* 0.6* 1.6* 3.9*  NEUTROABS 0.0* 0.0* 0.2* 0.8* 2.2  HGB 8.6* 7.8* 8.0* 7.6* 7.5*  HCT 26.2* 23.9* 24.1* 23.0* 22.3*  MCV 86.8 86.9 86.1 85.8 85.8  PLT 108* 84* 101* 98* 92*   Cardiac Enzymes: No results for input(s): CKTOTAL, CKMB, CKMBINDEX, TROPONINI in the last 168 hours. BNP (last 3 results) No results for input(s): BNP in the last 8760 hours.  ProBNP (last  3 results) No results for input(s): PROBNP in the last 8760 hours.  CBG: No results for input(s): GLUCAP in the last 168 hours.  Recent Results (from the past 240 hour(s))  Culture, blood (Routine X 2) w Reflex to ID Panel     Status: None   Collection Time: 09/16/15  8:48 AM  Result Value Ref Range Status   Specimen Description BLOOD BLOOD RIGHT WRIST  Final   Special Requests BOTTLES DRAWN AEROBIC AND ANAEROBIC 10CC  Final   Culture  Setup Time   Final    GRAM NEGATIVE RODS IN BOTH AEROBIC AND  ANAEROBIC BOTTLES CRITICAL RESULT CALLED TO, READ BACK BY AND VERIFIED WITHHewitt Shorts RN 2327 09/16/15 A BROWNING    Culture   Final    ESCHERICHIA COLI Performed at [facility]    Report Status 09/18/2015 FINAL  Final   Organism ID, Bacteria ESCHERICHIA COLI  Final      Susceptibility   Escherichia coli - MIC*    AMPICILLIN <=2 SENSITIVE Sensitive     CEFAZOLIN <=4 SENSITIVE Sensitive     CEFEPIME <=1 SENSITIVE Sensitive     CEFTAZIDIME <=1 SENSITIVE Sensitive     CEFTRIAXONE <=1 SENSITIVE Sensitive     CIPROFLOXACIN <=0.25 SENSITIVE Sensitive     GENTAMICIN <=1 SENSITIVE Sensitive     IMIPENEM <=0.25 SENSITIVE Sensitive     TRIMETH/SULFA <=20 SENSITIVE Sensitive     AMPICILLIN/SULBACTAM <=2 SENSITIVE Sensitive     PIP/TAZO <=4 SENSITIVE Sensitive     * ESCHERICHIA COLI  Culture, blood (Routine X 2) w Reflex to ID Panel     Status: None   Collection Time: 09/16/15  8:48 AM  Result Value Ref Range Status   Specimen Description BLOOD RIGHT ARM  Final   Special Requests BOTTLES DRAWN AEROBIC AND ANAEROBIC 10CC  Final   Culture  Setup Time   Final    GRAM NEGATIVE RODS IN BOTH AEROBIC AND ANAEROBIC BOTTLES CRITICAL RESULT CALLED TO, READ BACK BY AND VERIFIED WITHHewitt Shorts RN 2327 09/16/15 A BROWNING    Culture   Final    ESCHERICHIA COLI SUSCEPTIBILITIES PERFORMED ON PREVIOUS CULTURE WITHIN THE LAST 5 DAYS. Performed at [facility]    Report Status 09/18/2015 FINAL  Final  Urine culture     Status: None   Collection Time: 09/16/15  9:55 AM  Result Value Ref Range Status   Specimen Description URINE, CLEAN CATCH  Final   Special Requests NONE  Final   Culture   Final    MULTIPLE SPECIES PRESENT, SUGGEST RECOLLECTION Performed at [facility]    Report Status 09/17/2015 FINAL  Final  Gastrointestinal Panel by PCR , Stool     Status: None   Collection Time: 09/16/15  5:48 PM  Result Value Ref Range Status   Campylobacter species NOT DETECTED NOT  DETECTED Final   Plesimonas shigelloides NOT DETECTED NOT DETECTED Final   Salmonella species NOT DETECTED NOT DETECTED Final   Yersinia enterocolitica NOT DETECTED NOT DETECTED Final   Vibrio species NOT DETECTED NOT DETECTED Final   Vibrio cholerae NOT DETECTED NOT DETECTED Final   Enteroaggregative E coli (EAEC) NOT DETECTED NOT DETECTED Final   Enteropathogenic E coli (EPEC) NOT DETECTED NOT DETECTED Final   Enterotoxigenic E coli (ETEC) NOT DETECTED NOT DETECTED Final   Shiga like toxin producing E coli (STEC) NOT DETECTED NOT DETECTED Final   E. coli O157 NOT DETECTED NOT DETECTED Final   Shigella/Enteroinvasive  E coli (EIEC) NOT DETECTED NOT DETECTED Final   Cryptosporidium NOT DETECTED NOT DETECTED Final   Cyclospora cayetanensis NOT DETECTED NOT DETECTED Final   Entamoeba histolytica NOT DETECTED NOT DETECTED Final   Giardia lamblia NOT DETECTED NOT DETECTED Final   Adenovirus F40/41 NOT DETECTED NOT DETECTED Final   Astrovirus NOT DETECTED NOT DETECTED Final   Norovirus GI/GII NOT DETECTED NOT DETECTED Final   Rotavirus A NOT DETECTED NOT DETECTED Final   Sapovirus (I, II, IV, and V) NOT DETECTED NOT DETECTED Final  C difficile quick scan w PCR reflex     Status: Abnormal   Collection Time: 09/16/15  5:48 PM  Result Value Ref Range Status   C Diff antigen POSITIVE (A) NEGATIVE Final   C Diff toxin POSITIVE (A) NEGATIVE Final   C Diff interpretation Positive for toxigenic C. difficile  Final    Comment: CRITICAL RESULT CALLED TO, READ BACK BY AND VERIFIED WITH: Britta Mccreedy [phone]:[id number] @ 1902 BY J SCOTTON   Culture, blood (routine x 2)     Status: None (Preliminary result)   Collection Time: 09/19/15  5:34 AM  Result Value Ref Range Status   Specimen Description BLOOD RIGHT ARM  Final   Special Requests BOTTLES DRAWN AEROBIC AND ANAEROBIC [place]  Final   Culture   Final    NO GROWTH 2 DAYS Performed at [facility]    Report Status PENDING  Incomplete  Culture,  blood (routine x 2)     Status: None (Preliminary result)   Collection Time: 09/19/15  5:34 AM  Result Value Ref Range Status   Specimen Description BLOOD LEFT ARM  Final   Special Requests BOTTLES DRAWN AEROBIC AND ANAEROBIC [place]  Final   Culture   Final    NO GROWTH 2 DAYS Performed at [facility]    Report Status PENDING  Incomplete     Studies: No results found.  Scheduled Meds: . cefTRIAXone (ROCEPHIN)  IV  2 g Intravenous Q24H  . famotidine (PEPCID) IV  20 mg Intravenous Q12H  . loratadine  10 mg Oral Daily  . LORazepam  0.5 mg Oral QHS  . metronidazole  500 mg Intravenous Q8H  . ondansetron (ZOFRAN) IV  4 mg Intravenous 4 times per day  . oxymetazoline  1 spray Each Nare BID  . scopolamine  1 patch Transdermal Q72H  . sodium chloride flush  3 mL Intravenous Q12H  . vancomycin  500 mg Oral 4 times per day   Continuous Infusions: . sodium chloride 100 mL/hr at 09/21/15 1125     Time spent: 25 minutes    Haroon Shatto  [facility] Pager [phone]. If 7PM-7AM, please contact night-coverage at [email or web address], password [facility] 09/21/2015, 8:16 PM  LOS: 8 days

## 2015-09-22 ENCOUNTER — Ambulatory Visit: Payer: Medicare Other | Admitting: Oncology

## 2015-09-22 ENCOUNTER — Other Ambulatory Visit: Payer: Medicare Other

## 2015-09-22 ENCOUNTER — Ambulatory Visit: Payer: Medicare Other

## 2015-09-22 ENCOUNTER — Encounter: Payer: Medicare Other | Admitting: Nutrition

## 2015-09-22 LAB — COMPREHENSIVE METABOLIC PANEL
ALK PHOS: 105 U/L (ref 38–126)
ALT: 10 U/L — AB (ref 14–54)
AST: 18 U/L (ref 15–41)
Albumin: 1.9 g/dL — ABNORMAL LOW (ref 3.5–5.0)
Anion gap: 9 (ref 5–15)
BUN: 30 mg/dL — AB (ref 6–20)
CALCIUM: 7.3 mg/dL — AB (ref 8.9–10.3)
CHLORIDE: 124 mmol/L — AB (ref 101–111)
CO2: 10 mmol/L — ABNORMAL LOW (ref 22–32)
CREATININE: 2.64 mg/dL — AB (ref 0.44–1.00)
GFR, EST AFRICAN AMERICAN: 19 mL/min — AB (ref >60)
GFR, EST NON AFRICAN AMERICAN: 16 mL/min — AB (ref >60)
Glucose, Bld: 84 mg/dL (ref 65–99)
Potassium: 3.1 mmol/L — ABNORMAL LOW (ref 3.5–5.1)
Sodium: 143 mmol/L (ref 135–145)
Total Bilirubin: 0.8 mg/dL (ref 0.3–1.2)
Total Protein: 4.2 g/dL — ABNORMAL LOW (ref 6.5–8.1)

## 2015-09-22 LAB — CBC WITH DIFFERENTIAL/PLATELET
BASOS PCT: 0 %
Basophils Absolute: 0 10*3/uL (ref 0.0–0.1)
EOS ABS: 0.1 10*3/uL (ref 0.0–0.7)
EOS PCT: 2 %
HEMATOCRIT: 23.2 % — AB (ref 36.0–46.0)
HEMOGLOBIN: 7.6 g/dL — AB (ref 12.0–15.0)
LYMPHS PCT: 13 %
Lymphs Abs: 0.9 10*3/uL (ref 0.7–4.0)
MCH: 28.4 pg (ref 26.0–34.0)
MCHC: 32.8 g/dL (ref 30.0–36.0)
MCV: 86.6 fL (ref 78.0–100.0)
MONOS PCT: 12 %
Monocytes Absolute: 0.9 10*3/uL (ref 0.1–1.0)
NEUTROS ABS: 5.3 10*3/uL (ref 1.7–7.7)
NEUTROS PCT: 73 %
Platelets: 114 10*3/uL — ABNORMAL LOW (ref 150–400)
RBC: 2.68 MIL/uL — ABNORMAL LOW (ref 3.87–5.11)
RDW: 16.5 % — ABNORMAL HIGH (ref 11.5–15.5)
WBC: 7.2 10*3/uL (ref 4.0–10.5)

## 2015-09-22 LAB — GLUCOSE, CAPILLARY: GLUCOSE-CAPILLARY: 93 mg/dL (ref 65–99)

## 2015-09-22 MED ORDER — METRONIDAZOLE 500 MG PO TABS
500.0000 mg | ORAL_TABLET | Freq: Three times a day (TID) | ORAL | Status: DC
  Administered 2015-09-22 – 2015-09-23 (×5): 500 mg via ORAL
  Filled 2015-09-22 (×6): qty 1

## 2015-09-22 MED ORDER — POTASSIUM CHLORIDE 20 MEQ/15ML (10%) PO SOLN
40.0000 meq | Freq: Once | ORAL | Status: AC
  Administered 2015-09-22: 40 meq via ORAL
  Filled 2015-09-22: qty 30

## 2015-09-22 MED ORDER — SACCHAROMYCES BOULARDII 250 MG PO CAPS
250.0000 mg | ORAL_CAPSULE | Freq: Two times a day (BID) | ORAL | Status: DC
  Administered 2015-09-22 – 2015-09-23 (×3): 250 mg via ORAL
  Filled 2015-09-22 (×4): qty 1

## 2015-09-22 MED ORDER — POTASSIUM CHLORIDE 10 MEQ/100ML IV SOLN
10.0000 meq | INTRAVENOUS | Status: AC
  Administered 2015-09-22 (×3): 10 meq via INTRAVENOUS
  Filled 2015-09-22 (×3): qty 100

## 2015-09-22 MED ORDER — FAMOTIDINE 20 MG PO TABS
20.0000 mg | ORAL_TABLET | Freq: Two times a day (BID) | ORAL | Status: DC
  Administered 2015-09-22 – 2015-09-23 (×3): 20 mg via ORAL
  Filled 2015-09-22 (×4): qty 1

## 2015-09-22 NOTE — Progress Notes (Signed)
Daily Progress Note   Patient Name: Erica Simpson       Date: 09/22/2015 DOB: 06/04/1936  Age: 80 y.o. MRN#: [id number] Attending Physician: Nat Math, MD Primary Care Physician: Gennette Pac, MD Admit Date: 09/13/2015  Reason for Consultation/Follow-up: Establishing goals of care  Subjective: Patient expressed that her mouth dried out and her speech had changed.  Interval Events:  Scopolamine patch was applied 2/28 that appears to have dried up the patient's salivary excretions too much.  Will remove.  Patient still very focused on Quality of Life over Quantity.  Wants discharge as soon as appropriate.  Son in room appears very tense.   Length of Stay: 9 days  Current Medications: Scheduled Meds:  . cefTRIAXone (ROCEPHIN)  IV  2 g Intravenous Q24H  . famotidine (PEPCID) IV  20 mg Intravenous Q12H  . loratadine  10 mg Oral Daily  . LORazepam  0.5 mg Oral QHS  . metroNIDAZOLE  500 mg Oral 3 times per day  . ondansetron (ZOFRAN) IV  4 mg Intravenous 4 times per day  . oxymetazoline  1 spray Each Nare BID  . potassium chloride  40 mEq Oral Once  . potassium chloride  40 mEq Oral Once  . saccharomyces boulardii  250 mg Oral BID  . sodium chloride flush  3 mL Intravenous Q12H  . vancomycin  500 mg Oral 4 times per day    Continuous Infusions: . sodium chloride 100 mL/hr at 09/21/15 2207    PRN Meds: acetaminophen **OR** acetaminophen, HYDROcodone-acetaminophen, LORazepam, morphine injection, promethazine, sodium chloride flush, traMADol   Physical Exam        pleasant elderly female, NAD, A&O CV: RRR no m/r/g Resp: CTA no w/c/r Abdomen: Soft, Nt, Nd, +Bs Extremities: No swelling, able to move all four Skin: Small annular very tender area on  lateral plantar area of left foot  Vital Signs: BP 123/62 mmHg  Pulse 80  Temp(Src) 97.6 F (36.4 C) (Oral)  Resp 18  Ht 5\' 3"  (1.6 m)  Wt 75.5 kg (166 lb 7.2 oz)  BMI 29.49 kg/m2  SpO2 97% SpO2: SpO2: 97 % O2 Device: O2 Device: Not Delivered O2 Flow Rate:    Intake/output summary:  Intake/Output Summary (Last 24 hours) at 09/22/15 0857 Last data filed at 09/22/15 0700  Gross per  24 hour  Intake   3360 ml  Output    600 ml  Net   2760 ml   LBM: Last BM Date: 09/20/15 Baseline Weight: Weight: 62.959 kg (138 lb 12.8 oz) Most recent weight: Weight: 75.5 kg (166 lb 7.2 oz)       Palliative Assessment/Data: Flowsheet Rows        Most Recent Value   Intake Tab    Referral Department  Hospitalist   Unit at Time of Referral  Med/Surg Unit   Palliative Care Primary Diagnosis  Cancer   Date Notified  09/19/15   Palliative Care Type  New Palliative care   Reason for referral  Clarify Goals of Care   Date of Admission  09/13/15   Date first seen by Palliative Care  09/20/15   # of days Palliative referral response time  1 Day(s)   # of days IP prior to Palliative referral  6   Clinical Assessment    Palliative Performance Scale Score  50%   Other Max Last 24 Hours  8   Psychosocial & Spiritual Assessment    Palliative Care Outcomes    Patient/Family meeting held?  Yes   Who was at the meeting?  Son Julious Payer, and patient   Palliative Care Outcomes  Improved non-pain symptom therapy, Clarified goals of care, Changed to focus on comfort   Patient/Family wishes: Interventions discontinued/not started   Mechanical Ventilation, Vasopressors, BiPAP, Antibiotics, PEG      Additional Data Reviewed: CBC    Component Value Date/Time   WBC 7.2 09/22/2015 0540   WBC 3.7* 09/13/2015 1430   RBC 2.68* 09/22/2015 0540   RBC 3.35* 09/13/2015 1430   HGB 7.6* 09/22/2015 0540   HGB 9.0* 09/13/2015 1430   HCT 23.2* 09/22/2015 0540   HCT 28.0* 09/13/2015 1430   PLT 114*  09/22/2015 0540   PLT 310 09/13/2015 1430   MCV 86.6 09/22/2015 0540   MCV 83.5 09/13/2015 1430   MCH 28.4 09/22/2015 0540   MCH 26.8 09/13/2015 1430   MCHC 32.8 09/22/2015 0540   MCHC 32.1 09/13/2015 1430   RDW 16.5* 09/22/2015 0540   RDW 14.6* 09/13/2015 1430   LYMPHSABS 0.9 09/22/2015 0540   LYMPHSABS 0.7* 09/13/2015 1430   MONOABS 0.9 09/22/2015 0540   MONOABS 0.1 09/13/2015 1430   EOSABS 0.1 09/22/2015 0540   EOSABS 0.0 09/13/2015 1430   BASOSABS 0.0 09/22/2015 0540   BASOSABS 0.0 09/13/2015 1430    CMP     Component Value Date/Time   NA 143 09/22/2015 0540   NA 138 09/13/2015 1430   K 3.1* 09/22/2015 0540   K 5.6 Repeated and Verified, no hemolysis* 09/13/2015 1430   CL 124* 09/22/2015 0540   CO2 10* 09/22/2015 0540   CO2 21* 09/13/2015 1430   GLUCOSE 84 09/22/2015 0540   GLUCOSE 120 09/13/2015 1430   BUN 30* 09/22/2015 0540   BUN 69.0* 09/13/2015 1430   CREATININE 2.64* 09/22/2015 0540   CREATININE 3.0* 09/13/2015 1430   CALCIUM 7.3* 09/22/2015 0540   CALCIUM 10.2 09/13/2015 1430   PROT 4.2* 09/22/2015 0540   PROT 7.0 09/13/2015 1430   ALBUMIN 1.9* 09/22/2015 0540   ALBUMIN 2.7* 09/13/2015 1430   AST 18 09/22/2015 0540   AST 67* 09/13/2015 1430   ALT 10* 09/22/2015 0540   ALT 68* 09/13/2015 1430   ALKPHOS 105 09/22/2015 0540   ALKPHOS 400* 09/13/2015 1430   BILITOT 0.8 09/22/2015 0540   BILITOT 0.66  09/13/2015 1430   GFRNONAA 16* 09/22/2015 0540   GFRAA 19* 09/22/2015 0540       Problem List:  Patient Active Problem List   Diagnosis Date Noted  . Gastroesophageal reflux disease without esophagitis   . Palliative care encounter   . DNR (do not resuscitate)   . Goals of care, counseling/discussion   . E coli bacteremia 09/18/2015  . Enteritis due to Clostridium difficile 09/17/2015  . Bacteremia 09/17/2015  . Intractable nausea and vomiting   . Fever, unspecified   . Vomiting   . Chemotherapy-induced neutropenia ([place])   . Hypotension  09/13/2015  . Dehydration 09/13/2015  . Hyperkalemia 09/13/2015  . Acute-on-chronic kidney injury ([place]) 09/13/2015  . CKD (chronic kidney disease) stage 3, GFR 30-59 ml/min 09/13/2015  . Diarrhea 09/13/2015  . Polycystic kidney disease 09/13/2015  . Small bowel cancer ([place]) 09/07/2015  . GERD 08/25/2010  . DYSPHAGIA 08/25/2010  . PERSONAL HISTORY OF COLONIC POLYPS 08/25/2010     Palliative Care Assessment & Plan    1.Code Status:  DNR   2. Goals of Care/Additional Recommendations:  Increased quality of life.  Values Quality over Quantity.    Does not want further chemo therapy or invasive procedures.  3. Symptom Management:   GERD precautions when eating. Sit up, small low fat meals, avoid mints, choc, acids.  Continue Pepcid.  Painful plantars wart/blister on left foot - padded cushon  5. Prognosis:  Difficult to determine but likely less than 3 months.  6. Discharge Planning:  To SNF with Palliative, then Dr. Benay Spice will set up hospice services in her home later next week.   Care plan was discussed with patient and family at bedside.  Thank you for allowing the Palliative Medicine Team to assist in the care of this patient.   Time In: 845 Time Out: 900 Total Time 15 Prolonged Time Billed no        Imogene Burn, [place] [facility] Pager: [phone]  09/22/2015, 8:57 AM  Please contact Palliative Medicine Team phone at [phone] for questions and concerns.

## 2015-09-22 NOTE — Progress Notes (Signed)
Physical Therapy Treatment Patient Details Name: Erica Simpson MRN: [id number] DOB: [birth year] Today's Date: 09/22/2015    History of Present Illness 80 yo female admitted with acute on chronic kidney failure, C-diff. Hx of duodenal cancer, neutropenia, polycystic kidney disease, CKD.     PT Comments    Pt requesting to work with PT on today-feeling better than yesterday. Min assist for mobility. Requiring increased assistance compared to last session. Dyspnea 2/4 with minimal activity. O2 sats 99% on RA. Continue to recommend SNf.   Follow Up Recommendations  SNF     Equipment Recommendations  None recommended by PT    Recommendations for Other Services       Precautions / Restrictions Precautions Precautions: Fall Precaution Comments: C-diff Restrictions Weight Bearing Restrictions: No    Mobility  Bed Mobility Overal bed mobility: Needs Assistance Bed Mobility: Supine to Sit;Sit to Supine       Sit to supine: Supervision   General bed mobility comments: supervision for IV line  Transfers Overall transfer level: Needs assistance Equipment used: Rolling walker (2 wheeled) Transfers: Sit to/from Stand Sit to Stand: Min assist         General transfer comment: assist to rise, stabilize, control descent. VCs safety, hand placement  Ambulation/Gait Ambulation/Gait assistance: Min assist Ambulation Distance (Feet): 75 Feet Assistive device: Rolling walker (2 wheeled) Gait Pattern/deviations: Step-through pattern;Trunk flexed;Decreased stride length     General Gait Details: assist to stabilize. dyspnea 2/4.    Stairs            Wheelchair Mobility    Modified Rankin (Stroke Patients Only)       Balance Overall balance assessment: Needs assistance         Standing balance support: During functional activity Standing balance-Leahy Scale: Fair                      Cognition Arousal/Alertness: Awake/alert Behavior During  Therapy: WFL for tasks assessed/performed Overall Cognitive Status: Within Functional Limits for tasks assessed                      Exercises      General Comments        Pertinent Vitals/Pain Pain Assessment: No/denies pain    Home Living                      Prior Function            PT Goals (current goals can now be found in the care plan section) Progress towards PT goals: Progressing toward goals    Frequency  Min 3X/week    PT Plan Current plan remains appropriate    Co-evaluation             End of Session Equipment Utilized During Treatment: Gait belt Activity Tolerance: Patient tolerated treatment well Patient left: in bed;with call bell/phone within reach;with bed alarm set     Time: [id number] PT Time Calculation (min) (ACUTE ONLY): 18 min  Charges:  $Gait Training: 8-22 mins                    G Codes:      Weston Anna, MPT Pager: [phone]

## 2015-09-22 NOTE — Progress Notes (Signed)
[facility] PROGRESS NOTE  AKEA SIVERTSEN [id number] DOB: 1935-11-17 DOA: 09/13/2015 PCP: Gennette Pac, MD  Summary 09/21/15: [facility]. I have seen and examined Ms. Hoke at bedside, reviewed her chart and discussed with Dr. Benay Spice. I also spoke with her son [name] over the phone. MARELIN AFOLABI is a 80 y.o. female with Past medical history of polycystic kidney disease, chronic kidney disease stage III,. 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 Patient was recently started on chemotherapy she had her first cycle completed on 09/08/2015. In the hospital found to have c diff and ecoli bacteremia. She reports that she feels better but diarrhea is persistent. She should complete at least 7-10 days of antibiotics for Escherichia coli bacteremia. We'll therefore resume ceftriaxone, continue Flagyl/oral vancomycin and repeat blood cultures. She will likely discharge to skilled nursing facility in the next 24-48 hours. 09/22/15: [facility]. Patient anxious as to when she will be discharged. Her speech appears heavy-?scopolamine patch Potassium low due to diarrhea. Will continue Ceftriaxone/Flagyl(change to oral), and oral vancomycin. Likely d/c in am. Plan Escherichia coli bacteremia  Repeat blood cultures show no growth  Ceftriaxone for 1 more day, then switch to oral C. difficile colitis  On Flagyl/oral vancomycin to complete 2 weeks of antibiotics Small bowel cancer  Defer management to oncology  Code Status: DNR Family Communication: Spoke with son [name] over the phone. Disposition Plan: ?SNF Friday   Consultants:  Oncology  Procedures:    Antibiotics:  Oral vancomycin  Flagyl  Ceftriaxone  HPI/Subjective: Worried about discharge. Still has diarrhea. Feels weak.  Objective: Filed Vitals:   09/22/15 1438 09/22/15 2204  BP: 128/66 130/69  Pulse: 85 84  Temp: 97.6  F (36.4 C) 98.1 F (36.7 C)  Resp: 18 18    Intake/Output Summary (Last 24 hours) at 09/22/15 2358 Last data filed at 09/22/15 1641  Gross per 24 hour  Intake   1270 ml  Output   1350 ml  Net    -80 ml   Filed Weights   09/20/15 0601 09/21/15 0512 09/22/15 0557  Weight: 69.9 kg (154 lb 1.6 oz) 71.2 kg (156 lb 15.5 oz) 75.5 kg (166 lb 7.2 oz)    Exam:   General:  Comfortable at rest.  Cardiovascular: S1-S2 normal. No murmurs. Pulse regular.  Respiratory: Good air entry bilaterally. No rhonchi or rales.  Abdomen: Soft and nontender. Normal bowel sounds. No organomegaly.  Musculoskeletal: No pedal edema   Neurological: Intact  Data Reviewed: Basic Metabolic Panel:  Recent Labs Lab 09/16/15 0420 09/18/15 0436 09/19/15 0534 09/22/15 0540  NA 139 139 139 143  K 4.4 4.0 3.8 3.1*  CL 112* 117* 118* 124*  CO2 17* 14* 13* 10*  GLUCOSE 121* 97 99 84  BUN 39* 46* 43* 30*  CREATININE 2.68* 3.13* 2.93* 2.64*  CALCIUM 7.8* 7.4* 7.6* 7.3*   Liver Function Tests:  Recent Labs Lab 09/19/15 0534 09/22/15 0540  AST 19 18  ALT 23 10*  ALKPHOS 128* 105  BILITOT 0.9 0.8  PROT 4.4* 4.2*  ALBUMIN 1.8* 1.9*   No results for input(s): LIPASE, AMYLASE in the last 168 hours. No results for input(s): AMMONIA in the last 168 hours. CBC:  Recent Labs Lab 09/18/15 0436 09/19/15 0534 09/20/15 0450 09/21/15 0350 09/22/15 0540  WBC 0.3* 0.6* 1.6* 3.9* 7.2  NEUTROABS 0.0* 0.2* 0.8* 2.2 5.3  HGB 7.8* 8.0* 7.6* 7.5* 7.6*  HCT 23.9* 24.1*  23.0* 22.3* 23.2*  MCV 86.9 86.1 85.8 85.8 86.6  PLT 84* 101* 98* 92* 114*   Cardiac Enzymes: No results for input(s): CKTOTAL, CKMB, CKMBINDEX, TROPONINI in the last 168 hours. BNP (last 3 results) No results for input(s): BNP in the last 8760 hours.  ProBNP (last 3 results) No results for input(s): PROBNP in the last 8760 hours.  CBG:  Recent Labs Lab 09/22/15 0740  GLUCAP 93    Recent Results (from the past 240 hour(s))   Culture, blood (Routine X 2) w Reflex to ID Panel     Status: None   Collection Time: 09/16/15  8:48 AM  Result Value Ref Range Status   Specimen Description BLOOD BLOOD RIGHT WRIST  Final   Special Requests BOTTLES DRAWN AEROBIC AND ANAEROBIC 10CC  Final   Culture  Setup Time   Final    GRAM NEGATIVE RODS IN BOTH AEROBIC AND ANAEROBIC BOTTLES CRITICAL RESULT CALLED TO, READ BACK BY AND VERIFIED WITHHewitt Shorts RN 2327 09/16/15 A BROWNING    Culture   Final    ESCHERICHIA COLI Performed at [facility]    Report Status 09/18/2015 FINAL  Final   Organism ID, Bacteria ESCHERICHIA COLI  Final      Susceptibility   Escherichia coli - MIC*    AMPICILLIN <=2 SENSITIVE Sensitive     CEFAZOLIN <=4 SENSITIVE Sensitive     CEFEPIME <=1 SENSITIVE Sensitive     CEFTAZIDIME <=1 SENSITIVE Sensitive     CEFTRIAXONE <=1 SENSITIVE Sensitive     CIPROFLOXACIN <=0.25 SENSITIVE Sensitive     GENTAMICIN <=1 SENSITIVE Sensitive     IMIPENEM <=0.25 SENSITIVE Sensitive     TRIMETH/SULFA <=20 SENSITIVE Sensitive     AMPICILLIN/SULBACTAM <=2 SENSITIVE Sensitive     PIP/TAZO <=4 SENSITIVE Sensitive     * ESCHERICHIA COLI  Culture, blood (Routine X 2) w Reflex to ID Panel     Status: None   Collection Time: 09/16/15  8:48 AM  Result Value Ref Range Status   Specimen Description BLOOD RIGHT ARM  Final   Special Requests BOTTLES DRAWN AEROBIC AND ANAEROBIC 10CC  Final   Culture  Setup Time   Final    GRAM NEGATIVE RODS IN BOTH AEROBIC AND ANAEROBIC BOTTLES CRITICAL RESULT CALLED TO, READ BACK BY AND VERIFIED WITHHewitt Shorts RN 2327 09/16/15 A BROWNING    Culture   Final    ESCHERICHIA COLI SUSCEPTIBILITIES PERFORMED ON PREVIOUS CULTURE WITHIN THE LAST 5 DAYS. Performed at [facility]    Report Status 09/18/2015 FINAL  Final  Urine culture     Status: None   Collection Time: 09/16/15  9:55 AM  Result Value Ref Range Status   Specimen Description URINE, CLEAN CATCH  Final   Special  Requests NONE  Final   Culture   Final    MULTIPLE SPECIES PRESENT, SUGGEST RECOLLECTION Performed at [facility]    Report Status 09/17/2015 FINAL  Final  Gastrointestinal Panel by PCR , Stool     Status: None   Collection Time: 09/16/15  5:48 PM  Result Value Ref Range Status   Campylobacter species NOT DETECTED NOT DETECTED Final   Plesimonas shigelloides NOT DETECTED NOT DETECTED Final   Salmonella species NOT DETECTED NOT DETECTED Final   Yersinia enterocolitica NOT DETECTED NOT DETECTED Final   Vibrio species NOT DETECTED NOT DETECTED Final   Vibrio cholerae NOT DETECTED NOT DETECTED Final   Enteroaggregative E coli (EAEC) NOT DETECTED  NOT DETECTED Final   Enteropathogenic E coli (EPEC) NOT DETECTED NOT DETECTED Final   Enterotoxigenic E coli (ETEC) NOT DETECTED NOT DETECTED Final   Shiga like toxin producing E coli (STEC) NOT DETECTED NOT DETECTED Final   E. coli O157 NOT DETECTED NOT DETECTED Final   Shigella/Enteroinvasive E coli (EIEC) NOT DETECTED NOT DETECTED Final   Cryptosporidium NOT DETECTED NOT DETECTED Final   Cyclospora cayetanensis NOT DETECTED NOT DETECTED Final   Entamoeba histolytica NOT DETECTED NOT DETECTED Final   Giardia lamblia NOT DETECTED NOT DETECTED Final   Adenovirus F40/41 NOT DETECTED NOT DETECTED Final   Astrovirus NOT DETECTED NOT DETECTED Final   Norovirus GI/GII NOT DETECTED NOT DETECTED Final   Rotavirus A NOT DETECTED NOT DETECTED Final   Sapovirus (I, II, IV, and V) NOT DETECTED NOT DETECTED Final  C difficile quick scan w PCR reflex     Status: Abnormal   Collection Time: 09/16/15  5:48 PM  Result Value Ref Range Status   C Diff antigen POSITIVE (A) NEGATIVE Final   C Diff toxin POSITIVE (A) NEGATIVE Final   C Diff interpretation Positive for toxigenic C. difficile  Final    Comment: CRITICAL RESULT CALLED TO, READ BACK BY AND VERIFIED WITH: Britta Mccreedy [id number] @ 1902 BY J SCOTTON   Culture, blood (routine x 2)     Status:  None (Preliminary result)   Collection Time: 09/19/15  5:34 AM  Result Value Ref Range Status   Specimen Description BLOOD RIGHT ARM  Final   Special Requests BOTTLES DRAWN AEROBIC AND ANAEROBIC 6CC  Final   Culture   Final    NO GROWTH 3 DAYS Performed at [facility]    Report Status PENDING  Incomplete  Culture, blood (routine x 2)     Status: None (Preliminary result)   Collection Time: 09/19/15  5:34 AM  Result Value Ref Range Status   Specimen Description BLOOD LEFT ARM  Final   Special Requests BOTTLES DRAWN AEROBIC AND ANAEROBIC [place]  Final   Culture   Final    NO GROWTH 3 DAYS Performed at [facility]    Report Status PENDING  Incomplete  Culture, blood (routine x 2)     Status: None (Preliminary result)   Collection Time: 09/21/15  3:45 PM  Result Value Ref Range Status   Specimen Description BLOOD RIGHT HAND  Final   Special Requests BOTTLES DRAWN AEROBIC AND ANAEROBIC  10CC  Final   Culture   Final    NO GROWTH < 24 HOURS Performed at [facility]    Report Status PENDING  Incomplete  Culture, blood (routine x 2)     Status: None (Preliminary result)   Collection Time: 09/21/15  3:53 PM  Result Value Ref Range Status   Specimen Description BLOOD LEFT ARM  Final   Special Requests BOTTLES DRAWN AEROBIC AND ANAEROBIC  10CC  Final   Culture   Final    NO GROWTH < 24 HOURS Performed at [facility]    Report Status PENDING  Incomplete     Studies: No results found.  Scheduled Meds: . cefTRIAXone (ROCEPHIN)  IV  2 g Intravenous Q24H  . famotidine  20 mg Oral BID  . loratadine  10 mg Oral Daily  . LORazepam  0.5 mg Oral QHS  . metroNIDAZOLE  500 mg Oral 3 times per day  . ondansetron (ZOFRAN) IV  4 mg Intravenous 4 times per day  .  oxymetazoline  1 spray Each Nare BID  . saccharomyces boulardii  250 mg Oral BID  . sodium chloride flush  3 mL Intravenous Q12H  . vancomycin  500 mg Oral 4 times per day   Continuous Infusions: .  sodium chloride 100 mL/hr at 09/22/15 2228     Time spent: 25 minutes    Kollyns Mickelson  [facility] Pager [phone]. If 7PM-7AM, please contact night-coverage at [email or web address], password [facility] 09/22/2015, 11:58 PM  LOS: 9 days

## 2015-09-23 LAB — BASIC METABOLIC PANEL
ANION GAP: 10 (ref 5–15)
BUN: 27 mg/dL — ABNORMAL HIGH (ref 6–20)
CHLORIDE: 123 mmol/L — AB (ref 101–111)
CO2: 10 mmol/L — AB (ref 22–32)
CREATININE: 2.71 mg/dL — AB (ref 0.44–1.00)
Calcium: 7.6 mg/dL — ABNORMAL LOW (ref 8.9–10.3)
GFR calc non Af Amer: 16 mL/min — ABNORMAL LOW (ref >60)
GFR, EST AFRICAN AMERICAN: 18 mL/min — AB (ref >60)
Glucose, Bld: 91 mg/dL (ref 65–99)
POTASSIUM: 3.7 mmol/L (ref 3.5–5.1)
Sodium: 143 mmol/L (ref 135–145)

## 2015-09-23 LAB — CBC WITH DIFFERENTIAL/PLATELET
BASOS PCT: 0 %
Basophils Absolute: 0 10*3/uL (ref 0.0–0.1)
Eosinophils Absolute: 0.2 10*3/uL (ref 0.0–0.7)
Eosinophils Relative: 2 %
HEMATOCRIT: 22.7 % — AB (ref 36.0–46.0)
HEMOGLOBIN: 7.4 g/dL — AB (ref 12.0–15.0)
LYMPHS ABS: 1 10*3/uL (ref 0.7–4.0)
LYMPHS PCT: 12 %
MCH: 28.6 pg (ref 26.0–34.0)
MCHC: 32.6 g/dL (ref 30.0–36.0)
MCV: 87.6 fL (ref 78.0–100.0)
Monocytes Absolute: 1.1 10*3/uL — ABNORMAL HIGH (ref 0.1–1.0)
Monocytes Relative: 13 %
NEUTROS PCT: 73 %
Neutro Abs: 6.1 10*3/uL (ref 1.7–7.7)
Platelets: 133 10*3/uL — ABNORMAL LOW (ref 150–400)
RBC: 2.59 MIL/uL — AB (ref 3.87–5.11)
RDW: 17.2 % — ABNORMAL HIGH (ref 11.5–15.5)
WBC: 8.4 10*3/uL (ref 4.0–10.5)

## 2015-09-23 LAB — PHOSPHORUS: PHOSPHORUS: 2.4 mg/dL — AB (ref 2.5–4.6)

## 2015-09-23 LAB — MAGNESIUM: Magnesium: 1.7 mg/dL (ref 1.7–2.4)

## 2015-09-23 MED ORDER — HEPARIN SOD (PORK) LOCK FLUSH 100 UNIT/ML IV SOLN
500.0000 [IU] | INTRAVENOUS | Status: DC | PRN
  Administered 2015-09-23: 500 [IU]
  Filled 2015-09-23: qty 5

## 2015-09-23 MED ORDER — HEPARIN SOD (PORK) LOCK FLUSH 100 UNIT/ML IV SOLN
500.0000 [IU] | INTRAVENOUS | Status: DC
  Filled 2015-09-23: qty 5

## 2015-09-23 MED ORDER — AMOXICILLIN-POT CLAVULANATE 875-125 MG PO TABS: 1.0000 | ORAL_TABLET | Freq: Two times a day (BID) | ORAL | Status: DC

## 2015-09-23 MED ORDER — SACCHAROMYCES BOULARDII 250 MG PO CAPS: 250.0000 mg | ORAL_CAPSULE | Freq: Two times a day (BID) | ORAL | Status: DC

## 2015-09-23 MED ORDER — LORAZEPAM 0.5 MG PO TABS: 0.5000 mg | ORAL_TABLET | Freq: Every day | ORAL | Status: DC

## 2015-09-23 MED ORDER — VANCOMYCIN 50 MG/ML ORAL SOLUTION: 500.0000 mg | Freq: Four times a day (QID) | ORAL | Status: AC

## 2015-09-23 MED ORDER — METRONIDAZOLE 500 MG PO TABS: 500.0000 mg | ORAL_TABLET | Freq: Three times a day (TID) | ORAL | Status: DC

## 2015-09-23 NOTE — Progress Notes (Signed)
Report called to RN, Mickey Farber, at [place]. Patient is ready for d/c. [facility] here for transport

## 2015-09-23 NOTE — Care Management Note (Signed)
Case Management Note  Patient Details  Name: Erica Simpson MRN: [id number] Date of Birth: 1935/12/07  Subjective/Objective:                    Action/Plan:dc SNF.   Expected Discharge Date:                  Expected Discharge Plan:  Skilled Nursing Facility  In-House Referral:  Clinical Social Work  Discharge planning Services  CM Consult  Post Acute Care Choice:    Choice offered to:     DME Arranged:    DME Agency:     HH Arranged:    [place] Agency:     Status of Service:  Completed, signed off  Medicare Important Message Given:  Yes Date Medicare IM Given:    Medicare IM give by:    Date Additional Medicare IM Given:    Additional Medicare Important Message give by:     If discussed at [place] of Stay Meetings, dates discussed:    Additional Comments:  Dessa Phi, RN 09/23/2015, 10:23 AM

## 2015-09-23 NOTE — Clinical Social Work Placement (Signed)
Patient is set to discharge to [facility] today. Patient & son, [name] aware. Discharge packet given to RN, Deidre Ala. [facility] will be called for transport once IV team deaccesses the port-a-cath.    Raynaldo Opitz, [facility] Clinical Social Worker cell #: [phone]    CLINICAL SOCIAL WORK PLACEMENT  NOTE  Date:  09/23/2015  Patient Details  Name: Erica Simpson MRN: [id number] Date of Birth: [birth year]  Clinical Social Work is seeking post-discharge placement for this patient at the [place] level of care (*CSW will initial, date and re-position this form in  chart as items are completed):  Yes   Patient/family provided with [place] Work Department's list of facilities offering this level of care within the geographic area requested by the patient (or if unable, by the patient's family).  Yes   Patient/family informed of their freedom to choose among providers that offer the needed level of care, that participate in Medicare, Medicaid or managed care program needed by the patient, have an available bed and are willing to accept the patient.  Yes   Patient/family informed of [facility]'s ownership interest in [facility] and [facility], as well as of the fact that they are under no obligation to receive care at these facilities.  PASRR submitted to EDS on 09/19/15     PASRR number received on 09/19/15     Existing PASRR number confirmed on       FL2 transmitted to all facilities in geographic area requested by pt/family on 09/19/15     FL2 transmitted to all facilities within larger geographic area on       Patient informed that his/her managed care company has contracts with or will negotiate with certain facilities, including the following:        Yes   Patient/family informed of bed offers received.  Patient chooses bed at [facility] [place] recommends and patient chooses bed at       Patient to be transferred to [facility] on 09/23/15.  Patient to be transferred to facility by [facility]     Patient family notified on 09/23/15 of transfer.  Name of family member notified:  patient's son, [name] via phone     PHYSICIAN       Additional Comment:    _______________________________________________ Standley Brooking, LCSW 09/23/2015, 1:48 PM

## 2015-09-23 NOTE — Discharge Summary (Addendum)
Erica Simpson, is a 80 y.o. female  DOB 1936/07/08  MRN [id number].  Admission date:  09/13/2015  Admitting Physician  Reyne Dumas, MD  Discharge Date:  09/23/2015   Primary MD  Gennette Pac, MD  Recommendations for primary care physician for things to follow:  Please check hemoglobin in the next 3-5 days, consider PRBC transfusion if further drop. Follow with oncology.  Admission Diagnosis   small bowel cancer dehydration diarrhea   Discharge Diagnosis  small bowel cancer dehydration diarrhea   Principal Problem:   Acute-on-chronic kidney injury ([place]) Active Problems:   GERD   Small bowel cancer (HCC)   Hypotension   Dehydration   Hyperkalemia   CKD (chronic kidney disease) stage 3, GFR 30-59 ml/min   Diarrhea   Polycystic kidney disease   Chemotherapy-induced neutropenia (HCC)   Intractable nausea and vomiting   Fever, unspecified   Vomiting   Enteritis due to Clostridium difficile   Bacteremia   E coli bacteremia   Gastroesophageal reflux disease without esophagitis   Palliative care encounter   DNR (do not resuscitate)   Goals of care, counseling/discussion      Erica Simpson is a pleasant 80 y.o. female with polycystic kidney disease, chronic kidney disease stage III,. Small bowel cancer on first cycle of chemotherapy, who presented with complaints of episodes of diarrhea and she was found to have Escherichia coli bacteremia/C. difficile colitis. She was also pancytopenic(felt to be related to  Chemotherapy) requiring 2 units PRBC transfusion. [name] was placed on broad-spectrum antibiotics which have been narrowed to Augmentin to complete 4 more days, and oral Vancomycin/Flagyl to complete 2 weeks since initiation. Patient was referred to skilled nursing  facility where she will transfer today. She was followed by oncology Dr. Benay Spice who will see her in the office next week. Her hemoglobin prior to discharge is 7.5 g/dl and this will need to be followed in the next week or so. Otherwise patient is discharged in relatively stable condition. I have discussed the discharge plan with patient's son [name] and her sister.  Discharge Condition Stable.  Consults obtained  Oncology  Follow [facility] Oncology next week   Discharge Instructions  and  Discharge Medications  Discharge Instructions    Diet - low sodium heart healthy    Complete by:  As directed      Discharge instructions    Complete by:  As directed   Please follow Oncology     Increase activity slowly    Complete by:  As directed             Medication List    TAKE these medications        alendronate 70 MG tablet  Commonly known as:  FOSAMAX  Take 70 mg by mouth once a week. Take with a full glass of water on an empty stomach.     amLODipine 5 MG tablet  Commonly known as:  NORVASC  Take 5 mg by mouth daily.  amoxicillin-clavulanate 875-125 MG tablet  Commonly known as:  AUGMENTIN  Take 1 tablet by mouth 2 (two) times daily.     bisacodyl 5 MG EC tablet  Commonly known as:  DULCOLAX  Take 5 mg by mouth as needed for moderate constipation.     HYDROcodone-acetaminophen 5-325 MG tablet  Commonly known as:  NORCO/VICODIN  Take 1 tablet by mouth every 4 (four) hours as needed for moderate pain.     lidocaine-prilocaine cream  Commonly known as:  EMLA  Apply small amount over port area 1 hour prior to treatment and cover with plastic wrap.  DO NOT RUB IN     loratadine 10 MG tablet  Commonly known as:  CLARITIN  Take 10 mg by mouth daily.     LORazepam 0.5 MG tablet  Commonly known as:  ATIVAN  Take 1 tablet (0.5 mg total) by mouth at bedtime.     metroNIDAZOLE 500 MG tablet  Commonly known as:  FLAGYL  Take 1 tablet (500 mg total) by mouth every 8 (eight)  hours.     omeprazole 40 MG capsule  Commonly known as:  PRILOSEC  Take 40 mg by mouth daily as needed (acid reflux).     ondansetron 4 MG tablet  Commonly known as:  ZOFRAN  Take 1 tablet (4 mg total) by mouth every 6 (six) hours.     saccharomyces boulardii 250 MG capsule  Commonly known as:  FLORASTOR  Take 1 capsule (250 mg total) by mouth 2 (two) times daily.     traMADol 50 MG tablet  Commonly known as:  ULTRAM  Take 1 tablet (50 mg total) by mouth 3 (three) times daily as needed for moderate pain.     vancomycin 50 mg/mL oral solution  Commonly known as:  VANCOCIN  Take 10 mLs (500 mg total) by mouth every 6 (six) hours.        Diet and Activity recommendation: See Discharge Instructions above  Major procedures and Radiology Reports - PLEASE review detailed and final reports for all details, in brief -    Ct Abdomen Pelvis Wo Contrast  08/26/2015  CLINICAL DATA:  Duodenal mass or recently diagnosed by endoscopy. 10 pound weight loss and nausea over past 2 months. Polycystic kidney disease. EXAM: CT ABDOMEN AND PELVIS WITHOUT CONTRAST TECHNIQUE: Multidetector CT imaging of the abdomen and pelvis was performed following the standard protocol without IV contrast. COMPARISON:  11/14/2006 FINDINGS: Lower chest: New sub-cm pulmonary nodules are seen in both lung bases, highly suspicious for pulmonary metastases. Hepatobiliary: Several previously seen hepatic cysts are again noted, however there are multiple ill-defined soft tissue acute attenuation masses seen throughout the liver which are new since previous study and consistent with diffuse liver metastases. Pancreas: A soft tissue mass is seen involving the transverse duodenum which is contiguous with the pancreatic uncinate process. This measures 3.0 x 5.8 cm on image 37/series 2. The remainder the pancreas is unremarkable in appearance on this unenhanced exam. Spleen: Within normal limits in size. Adrenals/Urinary Tract: Adrenal  glands are unremarkable. Both kidneys are enlarged with numerous cystic lesions again seen, consistent polycystic kidney disease. Several small left renal calculi are again seen, however there is no evidence of ureteral calculi or hydronephrosis. Stomach/Bowel: Mass involving the transverse duodenum measures 3.0 x 5.8 cm on image 37/series 2. This mass is contiguous with the pancreatic uncinate process. No evidence of bowel obstruction. Colonic diverticulosis noted, without evidence of diverticulitis. Vascular/Lymphatic: Mild lymphadenopathy seen within the  central small bowel mesenteric, with largest index lymph node measuring 1.4 cm on image 33/series 2. Mild lymphadenopathy also seen within the porta hepatis, with the index portacaval lymph node measuring 11 mm on image 26/series 2. Reproductive: Prior hysterectomy noted. Adnexal regions are unremarkable in appearance. Tiny amount of free fluid noted in pelvis. Other: None. Musculoskeletal:  No suspicious bone lesions identified. IMPRESSION: 5.8 cm soft tissue mass involving the transverse duodenum which is also contiguous with the pancreatic uncinate process. Differential diagnosis includes primary duodenal carcinoma and pancreatic carcinoma. Metastatic lymphadenopathy within the central small bowel mesentery and porta hepatis. Diffuse liver metastases. New sub-cm bibasilar pulmonary nodules, highly suspicious for pulmonary metastases. Polycystic kidney disease and nonobstructive renal calculi again noted. Electronically Signed   By: Earle Gell M.D.   On: 08/26/2015 09:59   Ir Fluoro Guide Cv Line Right  09/01/2015  CLINICAL DATA:  Duodenal carcinoma. Needs long-term venous access for chemotherapy. EXAM: TUNNELED PORT CATHETER PLACEMENT WITH ULTRASOUND AND FLUOROSCOPIC GUIDANCE FLUOROSCOPY TIME:  0.1 minute, 1 mGy ANESTHESIA/SEDATION: Intravenous Fentanyl and Versed were administered as conscious sedation during continuous monitoring of the patient's level of  consciousness and physiological / cardiorespiratory status by the radiology RN, with a total moderate sedation time of 16 minutes. TECHNIQUE: The procedure, risks, benefits, and alternatives were explained to the patient. Questions regarding the procedure were encouraged and answered. The patient understands and consents to the procedure. As antibiotic prophylaxis, cefazolin 2 g was ordered pre-procedure and administered intravenously within one hour of incision. Patency of the right IJ vein was confirmed with ultrasound with image documentation. An appropriate skin site was determined. Skin site was marked. Region was prepped using maximum barrier technique including cap and mask, sterile gown, sterile gloves, large sterile sheet, and Chlorhexidine as cutaneous antisepsis. The region was infiltrated locally with 1% lidocaine. Under real-time ultrasound guidance, the right IJ vein was accessed with a 21 gauge micropuncture needle; the needle tip within the vein was confirmed with ultrasound image documentation. Needle was exchanged over a 018 guidewire for transitional dilator which allowed passage of the [facility] wire into the IVC. Over this, the transitional dilator was exchanged for a 5 [place] MPA catheter. A small incision was made on the right anterior chest wall and a subcutaneous pocket fashioned. The power-injectable port was positioned and its catheter tunneled to the right IJ dermatotomy site. The MPA catheter was exchanged over an Amplatz wire for a peel-away sheath, through which the port catheter, which had been trimmed to the appropriate length, was advanced and positioned under fluoroscopy with its tip at the cavoatrial junction. Spot chest radiograph confirms good catheter position and no pneumothorax. The pocket was closed with deep interrupted and subcuticular continuous 3-0 Monocryl sutures. The port was flushed per protocol. The incisions were covered with Dermabond then covered with a sterile  dressing. COMPLICATIONS: COMPLICATIONS None immediate IMPRESSION: Technically successful right IJ power-injectable port catheter placement. Ready for routine use. Electronically Signed   By: Lucrezia Europe M.D.   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 EXAM: TUNNELED PORT CATHETER PLACEMENT WITH ULTRASOUND AND FLUOROSCOPIC GUIDANCE FLUOROSCOPY TIME:  0.1 minute, 1 mGy ANESTHESIA/SEDATION: Intravenous Fentanyl and Versed were administered as conscious sedation during continuous monitoring of the patient's level of consciousness and physiological / cardiorespiratory status by the radiology RN, with a total moderate sedation time of 16 minutes. TECHNIQUE: The procedure, risks, benefits,  and alternatives were explained to the patient. Questions regarding the procedure were encouraged and answered. The patient understands and consents to the procedure. As antibiotic prophylaxis, cefazolin 2 g was ordered pre-procedure and administered intravenously within one hour of incision. Patency of the right IJ vein was confirmed with ultrasound with image documentation. An appropriate skin site was determined. Skin site was marked. Region was prepped using maximum barrier technique including cap and mask, sterile gown, sterile gloves, large sterile sheet, and Chlorhexidine as cutaneous antisepsis. The region was infiltrated locally with 1% lidocaine. Under real-time ultrasound guidance, the right IJ vein was accessed with a 21 gauge micropuncture needle; the needle tip within the vein was confirmed with ultrasound image documentation. Needle was exchanged over a 018 guidewire for transitional dilator which allowed passage of the [facility] wire into the IVC. Over this, the transitional dilator was exchanged for a 5 [place] MPA catheter. A small incision was made on the right anterior chest wall and a subcutaneous pocket  fashioned. The power-injectable port was positioned and its catheter tunneled to the right IJ dermatotomy site. The MPA catheter was exchanged over an Amplatz wire for a peel-away sheath, through which the port catheter, which had been trimmed to the appropriate length, was advanced and positioned under fluoroscopy with its tip at the cavoatrial junction. Spot chest radiograph confirms good catheter position and no pneumothorax. The pocket was closed with deep interrupted and subcuticular continuous 3-0 Monocryl sutures. The port was flushed per protocol. The incisions were covered with Dermabond then covered with a sterile dressing. COMPLICATIONS: COMPLICATIONS None immediate IMPRESSION: Technically successful right IJ power-injectable port catheter placement. Ready for routine use. Electronically Signed   By: Lucrezia Europe M.D.   On: 09/01/2015 11:31   Acute Abdominal Series  09/13/2015  CLINICAL DATA:  Polycystic kidney disease, stage 3 chronic kidney disease. Small bowel cancer and for sec left chemotherapy. Diarrhea. Nausea and vomiting. EXAM: DG ABDOMEN ACUTE W/ 1V CHEST COMPARISON:  08/26/2015 CT abdomen FINDINGS: Power injectable Port-A-Cath tip: SVC. The small scattered pulmonary nodules shown on abdomen CT are not all that apparent on conventional radiography. Tortuous thoracic aorta. Heart size within normal limits. Left-side-down lateral decubitus view the abdomen demonstrates no free intraperitoneal gas. A few air-fluid levels in nondilated loops of small bowel. Small bowel loops measure up to about 2.6 cm. There is gas throughout much of the colon including the rectum. A small amount of residual barium is present and some scattered sigmoid colon diverticula. No compelling findings of ascites. IMPRESSION: 1. Accentuated gas and a paucity of formed stool in the colon noted. This might relate to diarrheal process. 2. No dilated bowel to suggest obstruction. No free intraperitoneal gas. 3. The prior tiny  pulmonary nodules scattered in the lung bases on prior CT are not readily apparent on conventional radiographs. Electronically Signed   By: Van Clines M.D.   On: 09/13/2015 19:53   Dg Abd Portable 1v  09/16/2015  CLINICAL DATA:  Nausea and abdominal pain EXAM: PORTABLE ABDOMEN - 1 VIEW COMPARISON:  CT 09/13/2015 FINDINGS: No dilated loops of large or small bowel. Gas within the rectum. No organomegaly. No pathologic calcifications. No aggressive osseous lesion IMPRESSION: No acute abdominal findings. Electronically Signed   By: Suzy Bouchard M.D.   On: 09/16/2015 09:00    Micro Results   Recent Results (from the past 240 hour(s))  Culture, blood (Routine X 2) w Reflex to ID Panel     Status: None   Collection  Time: 09/16/15  8:48 AM  Result Value Ref Range Status   Specimen Description BLOOD BLOOD RIGHT WRIST  Final   Special Requests BOTTLES DRAWN AEROBIC AND ANAEROBIC 10CC  Final   Culture  Setup Time   Final    GRAM NEGATIVE RODS IN BOTH AEROBIC AND ANAEROBIC BOTTLES CRITICAL RESULT CALLED TO, READ BACK BY AND VERIFIED WITHHewitt Shorts RN 2327 09/16/15 A BROWNING    Culture   Final    ESCHERICHIA COLI Performed at [facility]    Report Status 09/18/2015 FINAL  Final   Organism ID, Bacteria ESCHERICHIA COLI  Final      Susceptibility   Escherichia coli - MIC*    AMPICILLIN <=2 SENSITIVE Sensitive     CEFAZOLIN <=4 SENSITIVE Sensitive     CEFEPIME <=1 SENSITIVE Sensitive     CEFTAZIDIME <=1 SENSITIVE Sensitive     CEFTRIAXONE <=1 SENSITIVE Sensitive     CIPROFLOXACIN <=0.25 SENSITIVE Sensitive     GENTAMICIN <=1 SENSITIVE Sensitive     IMIPENEM <=0.25 SENSITIVE Sensitive     TRIMETH/SULFA <=20 SENSITIVE Sensitive     AMPICILLIN/SULBACTAM <=2 SENSITIVE Sensitive     PIP/TAZO <=4 SENSITIVE Sensitive     * ESCHERICHIA COLI  Culture, blood (Routine X 2) w Reflex to ID Panel     Status: None   Collection Time: 09/16/15  8:48 AM  Result Value Ref Range Status    Specimen Description BLOOD RIGHT ARM  Final   Special Requests BOTTLES DRAWN AEROBIC AND ANAEROBIC 10CC  Final   Culture  Setup Time   Final    GRAM NEGATIVE RODS IN BOTH AEROBIC AND ANAEROBIC BOTTLES CRITICAL RESULT CALLED TO, READ BACK BY AND VERIFIED WITHHewitt Shorts RN 2327 09/16/15 A BROWNING    Culture   Final    ESCHERICHIA COLI SUSCEPTIBILITIES PERFORMED ON PREVIOUS CULTURE WITHIN THE LAST 5 DAYS. Performed at [facility]    Report Status 09/18/2015 FINAL  Final  Urine culture     Status: None   Collection Time: 09/16/15  9:55 AM  Result Value Ref Range Status   Specimen Description URINE, CLEAN CATCH  Final   Special Requests NONE  Final   Culture   Final    MULTIPLE SPECIES PRESENT, SUGGEST RECOLLECTION Performed at [facility]    Report Status 09/17/2015 FINAL  Final  Gastrointestinal Panel by PCR , Stool     Status: None   Collection Time: 09/16/15  5:48 PM  Result Value Ref Range Status   Campylobacter species NOT DETECTED NOT DETECTED Final   Plesimonas shigelloides NOT DETECTED NOT DETECTED Final   Salmonella species NOT DETECTED NOT DETECTED Final   Yersinia enterocolitica NOT DETECTED NOT DETECTED Final   Vibrio species NOT DETECTED NOT DETECTED Final   Vibrio cholerae NOT DETECTED NOT DETECTED Final   Enteroaggregative E coli (EAEC) NOT DETECTED NOT DETECTED Final   Enteropathogenic E coli (EPEC) NOT DETECTED NOT DETECTED Final   Enterotoxigenic E coli (ETEC) NOT DETECTED NOT DETECTED Final   Shiga like toxin producing E coli (STEC) NOT DETECTED NOT DETECTED Final   E. coli O157 NOT DETECTED NOT DETECTED Final   Shigella/Enteroinvasive E coli (EIEC) NOT DETECTED NOT DETECTED Final   Cryptosporidium NOT DETECTED NOT DETECTED Final   Cyclospora cayetanensis NOT DETECTED NOT DETECTED Final   Entamoeba histolytica NOT DETECTED NOT DETECTED Final   Giardia lamblia NOT DETECTED NOT DETECTED Final   Adenovirus F40/41 NOT DETECTED NOT DETECTED Final  Astrovirus NOT DETECTED NOT DETECTED Final   Norovirus GI/GII NOT DETECTED NOT DETECTED Final   Rotavirus A NOT DETECTED NOT DETECTED Final   Sapovirus (I, II, IV, and V) NOT DETECTED NOT DETECTED Final  C difficile quick scan w PCR reflex     Status: Abnormal   Collection Time: 09/16/15  5:48 PM  Result Value Ref Range Status   C Diff antigen POSITIVE (A) NEGATIVE Final   C Diff toxin POSITIVE (A) NEGATIVE Final   C Diff interpretation Positive for toxigenic C. difficile  Final    Comment: CRITICAL RESULT CALLED TO, READ BACK BY AND VERIFIED WITH: Britta Mccreedy [id number] @ 1902 BY J SCOTTON   Culture, blood (routine x 2)     Status: None (Preliminary result)   Collection Time: 09/19/15  5:34 AM  Result Value Ref Range Status   Specimen Description BLOOD RIGHT ARM  Final   Special Requests BOTTLES DRAWN AEROBIC AND ANAEROBIC [place]  Final   Culture   Final    NO GROWTH 4 DAYS Performed at [facility]    Report Status PENDING  Incomplete  Culture, blood (routine x 2)     Status: None (Preliminary result)   Collection Time: 09/19/15  5:34 AM  Result Value Ref Range Status   Specimen Description BLOOD LEFT ARM  Final   Special Requests BOTTLES DRAWN AEROBIC AND ANAEROBIC [place]  Final   Culture   Final    NO GROWTH 4 DAYS Performed at [facility]    Report Status PENDING  Incomplete  Culture, blood (routine x 2)     Status: None (Preliminary result)   Collection Time: 09/21/15  3:45 PM  Result Value Ref Range Status   Specimen Description BLOOD RIGHT HAND  Final   Special Requests BOTTLES DRAWN AEROBIC AND ANAEROBIC  10CC  Final   Culture   Final    NO GROWTH 2 DAYS Performed at [facility]    Report Status PENDING  Incomplete  Culture, blood (routine x 2)     Status: None (Preliminary result)   Collection Time: 09/21/15  3:53 PM  Result Value Ref Range Status   Specimen Description BLOOD LEFT ARM  Final   Special Requests BOTTLES DRAWN AEROBIC AND  ANAEROBIC  10CC  Final   Culture   Final    NO GROWTH 2 DAYS Performed at [facility]    Report Status PENDING  Incomplete       Today   Subjective:   Acey Lav denies any complaints..   Objective:   Blood pressure 140/68, pulse 88, temperature 97.8 F (36.6 C), temperature source Oral, resp. rate 18, height 5\' 3"  (1.6 m), weight 75.3 kg (166 lb 0.1 oz), SpO2 99 %.   Intake/Output Summary (Last 24 hours) at 09/23/15 1251 Last data filed at 09/23/15 1206  Gross per 24 hour  Intake   2500 ml  Output   1000 ml  Net   1500 ml    Exam No acute changes  Data Review   CBC w Diff: Lab Results  Component Value Date   WBC 8.4 09/23/2015   WBC 3.7* 09/13/2015   HGB 7.4* 09/23/2015   HGB 9.0* 09/13/2015   HCT 22.7* 09/23/2015   HCT 28.0* 09/13/2015   PLT 133* 09/23/2015   PLT 310 09/13/2015   LYMPHOPCT 12 09/23/2015   LYMPHOPCT 18.3 09/13/2015   MONOPCT 13 09/23/2015   MONOPCT 1.5 09/13/2015   EOSPCT 2 09/23/2015  EOSPCT 1.0 09/13/2015   BASOPCT 0 09/23/2015   BASOPCT 0.7 09/13/2015    CMP: Lab Results  Component Value Date   NA 143 09/23/2015   NA 138 09/13/2015   K 3.7 09/23/2015   K 5.6 Repeated and Verified, no hemolysis* 09/13/2015   CL 123* 09/23/2015   CO2 10* 09/23/2015   CO2 21* 09/13/2015   BUN 27* 09/23/2015   BUN 69.0* 09/13/2015   CREATININE 2.71* 09/23/2015   CREATININE 3.0* 09/13/2015   PROT 4.2* 09/22/2015   PROT 7.0 09/13/2015   ALBUMIN 1.9* 09/22/2015   ALBUMIN 2.7* 09/13/2015   BILITOT 0.8 09/22/2015   BILITOT 0.66 09/13/2015   ALKPHOS 105 09/22/2015   ALKPHOS 400* 09/13/2015   AST 18 09/22/2015   AST 67* 09/13/2015   ALT 10* 09/22/2015   ALT 68* 09/13/2015  .   Total Time in preparing paper work, data evaluation and todays exam - 35 minutes  Tarika Mckethan M.D on 09/23/2015 at 12:51 PM  [facility] Office  [phone]

## 2015-09-23 NOTE — Progress Notes (Signed)
[facility] called for transport - patient's son, [name] made aware.    Raynaldo Opitz, [facility] Clinical Social Worker cell #: [phone]

## 2015-09-24 LAB — CULTURE, BLOOD (ROUTINE X 2)
Culture: NO GROWTH
Culture: NO GROWTH

## 2015-09-26 ENCOUNTER — Encounter: Payer: Self-pay | Admitting: *Deleted

## 2015-09-26 ENCOUNTER — Telehealth: Payer: Self-pay | Admitting: *Deleted

## 2015-09-26 LAB — CULTURE, BLOOD (ROUTINE X 2)
CULTURE: NO GROWTH
CULTURE: NO GROWTH

## 2015-09-26 NOTE — Progress Notes (Signed)
[place] Work  Clinical Social Work was contacted by pt's son due to questions re hospice referral and getting services in place for pt after [facility] SN[facility] stay. Son anxious about this transition home and CSW attempted to answer questions. Son has [facility] coming to the house on Wed to discuss care plan and need for DME. Son feels pt will need a hospital bed and a bedside commode once she transitions home. Son eager for a timeline and "what to expect" details. CSW discussed concerns with Merceda Elks, RN as well. CSW plans to attend appt with son and pt on Friday morning.   Clinical Social Work interventions: Supportive listening Education  Loren Racer, [place] Social Worker [place]  [place] Phone: [phone] Fax: [phone]

## 2015-09-26 NOTE — Telephone Encounter (Signed)
Call from [place] at [place]: Does pt need port flushes or maintenance? Port last flushed 09/23/15, needs to be flushed every 6 weeks. If not done in office we will contact nursing home. She voiced understanding.

## 2015-09-26 NOTE — Telephone Encounter (Signed)
Oncology Nurse Navigator Documentation  Oncology Nurse Navigator Flowsheets 09/26/2015  Navigator Location [facility]  Navigator Encounter Type Telephone  Telephone Incoming Call;Outgoing Call;Patient Update;Diagnostic Results  Abnormal Finding Date -  Confirmed Diagnosis Date -  Treatment Initiated Date -  Patient Visit Type -  Treatment Phase Other--currently in SNF--  Barriers/Navigation Needs Family concerns--transfer home with palliative care; not eating; lab results  Education Pain/ Symptom Management;Accessing Care/ Finding Providers;Other--CBC/transfusion need  Interventions Coordination of Care  Referrals -  Coordination of Care Other--called facility and spoke with nurse Tonya--results of CBC will be back late this afternoon-she will fax results.  Education Method -  Support Groups/Services -  Acuity Level 2  Time Spent with Patient 15  Left message w/son that we will be glad to help coordinate her transfer from SNF to home with Hospice if this is their request. Requested he return call to discuss

## 2015-09-27 ENCOUNTER — Telehealth: Payer: Self-pay | Admitting: *Deleted

## 2015-09-27 NOTE — Telephone Encounter (Signed)
  Oncology Nurse Navigator Documentation  Navigator Location: [facility] (09/27/15 0826) Navigator Encounter Type: Telephone (09/27/15 [id number]) Telephone: Outgoing Call;Diagnostic Results (09/27/15 [id number]) : Left VM for nurse at [facility] requesting they fax her lab results to [phone] as soon as available. Called son and offered appointment with Dr. Benay Spice for 09/29/15 at 0830. He agrees to bring her at that time. Tells RN she is trying to eat this morning.

## 2015-09-29 ENCOUNTER — Telehealth: Payer: Self-pay | Admitting: Oncology

## 2015-09-29 ENCOUNTER — Ambulatory Visit (HOSPITAL_BASED_OUTPATIENT_CLINIC_OR_DEPARTMENT_OTHER): Payer: Medicare Other | Admitting: Oncology

## 2015-09-29 ENCOUNTER — Encounter: Payer: Self-pay | Admitting: *Deleted

## 2015-09-29 ENCOUNTER — Other Ambulatory Visit: Payer: Self-pay | Admitting: *Deleted

## 2015-09-29 VITALS — BP 134/73 | HR 103 | Temp 97.5°F | Resp 18 | Ht 63.0 in | Wt 153.6 lb

## 2015-09-29 DIAGNOSIS — N19 Unspecified kidney failure: Secondary | ICD-10-CM | POA: Diagnosis not present

## 2015-09-29 DIAGNOSIS — C179 Malignant neoplasm of small intestine, unspecified: Secondary | ICD-10-CM | POA: Diagnosis present

## 2015-09-29 DIAGNOSIS — D631 Anemia in chronic kidney disease: Secondary | ICD-10-CM

## 2015-09-29 NOTE — Progress Notes (Signed)
  Erica Simpson   Diagnosis: Small bowel carcinoma  INTERVAL HISTORY:   Erica Simpson returns as scheduled. She was discharged from the hospital 09/23/2015 after an admission with Escherichia coli bacteremia and C. difficile colitis following a first cycle of FOLFIRI.  She was discharged to a skilled nursing facility. She plans to return home today. Augmentin causes nausea. No further diarrhea. She is eating.  Objective:  Vital signs in last 24 hours:  Blood pressure 134/73, pulse 103, temperature 97.5 F (36.4 C), temperature source Oral, resp. rate 18, height 5\' 3"  (1.6 m), weight 153 lb 9.6 oz (69.673 kg), SpO2 100 %.    HEENT: No thrush Resp: Lungs clear bilaterally Cardio: Regular rate and rhythm GI: No hepatomegaly, nontender, no mass Vascular: Trace edema at the lower leg and ankle bilaterally Neuro: Alert and oriented    Portacath/PICC-without erythema  Lab Results:  Lab Results  Component Value Date   WBC 8.4 09/23/2015   HGB 7.4* 09/23/2015   HCT 22.7* 09/23/2015   MCV 87.6 09/23/2015   PLT 133* 09/23/2015   NEUTROABS 6.1 09/23/2015   09/29/2015: Hemoglobin 8, platelets 373,000, white count 6.9  Medications: I have reviewed the patient's current medications.  Assessment/Plan: 1. Adenocarcinoma involving a duodenal mass  CT 08/27/2015 consistent with a duodenal mass/uncinate mass, liver metastases, pulmonary metastases, and metastatic lymphadenopathy  Cycle 1 FOLFIRI 09/08/2015  2. Anorexia/weight loss  3.Nausea/vomiting-most likely secondary to the duodenal mass or C. difficile colitis-improved  4. Polycystic kidney disease/renal failure  5. Anemia secondary to renal failure, bleeding from the duodenal tumor, and chemotherapy, status post a red cell transfusion 09/14/2015  6. Neutropenia/thrombocytopenia secondary to chemotherapy-resolved  7. History of diarrhea secondary chemotherapy and C. difficile  colitis-resolved  8. Escherichia coli bacteremia 09/16/2015  9. C. difficile colitis-Flagyl started 09/17/2015   Disposition:  Erica Simpson appears stable. She plans to return home from the skilled nursing facility today. She agrees to a [facility] referral. We discussed CPR and ACLS issues. She will be placed on a no CODE BLUE status.  Erica Simpson has completed a course of antibiotics for the Escherichia coli bacteremia and C. difficile colitis. She will return for an office visit and Port-A-Cath flush on 10/17/2015.  Betsy Coder, MD  09/29/2015  9:30 AM

## 2015-09-29 NOTE — Telephone Encounter (Signed)
cld and spoke to son and gave pt next appt time & date for 3/27

## 2015-09-29 NOTE — Progress Notes (Signed)
[place] Work  Clinical Social Work was referred by patient's son for assessment of psychosocial needs.  Son requested to meet with CSW and had many questions about hospice. Clinical Social Worker met with patient, her sister and son at [facility] after appt to offer support and assess for needs.  Son had quite a few specific questions that CSW suggested he ask once he meets with their hospice team. CSW attempted to answer his general questions about the hospice process. Pt was in very good spirits and was very excited to go home today from SNF. Son was somewhat anxious and CSW attempted to reassure him today. CSW encouraged pt and son to reach out as needed, but that hospice CSW will be able to better address concerns related to their area of expertise. CSW can check in at next appointment.   Clinical Social Work interventions: Education and supportive listening  Loren Racer, [place] Worker [place]  [place] Phone: [phone] Fax: [phone]

## 2015-09-29 NOTE — Progress Notes (Signed)
Oncology Nurse Navigator Documentation  Oncology Nurse Navigator Flowsheets 09/29/2015  Navigator Location [facility]  Navigator Encounter Type Follow-up Appt  Telephone -  Abnormal Finding Date -  Confirmed Diagnosis Date -  Treatment Initiated Date -  Patient Visit Type MedOnc  Treatment Phase Other--Supportive Care  Barriers/Navigation Needs Coordination of Care  Education Other--Out of Facility DNR form  Interventions Coordination of Care;Referrals--[facility]  Referrals Palliative Care  Coordination of Care Hospice/Palliative Care--called [facility] referral line-will see patient on 09/30/15  Education Method Verbal  Support Groups/Services -  Acuity Level 1  Time Spent with Patient [place] is comfortable and anxious to go home. Family has arranged for private duty aid to be with her 12 hours/day for now. Reminded patient to use walker, move slowly and ask for help if needed. She is high fall risk.

## 2015-09-30 ENCOUNTER — Ambulatory Visit: Payer: Medicare Other | Admitting: Oncology

## 2015-09-30 ENCOUNTER — Other Ambulatory Visit: Payer: Medicare Other

## 2015-10-03 ENCOUNTER — Telehealth: Payer: Self-pay | Admitting: Oncology

## 2015-10-03 NOTE — Telephone Encounter (Signed)
Per LT moved 3/27 f/u to BS. Spoke with patient re change and new time for 3/27 lab/flush/BS at 11:15 am.

## 2015-10-06 ENCOUNTER — Telehealth: Payer: Self-pay | Admitting: *Deleted

## 2015-10-06 NOTE — Telephone Encounter (Signed)
Oncology Nurse Navigator Documentation  Oncology Nurse Navigator Flowsheets 10/06/2015  Navigator Location [facility]  Navigator Encounter Type Telephone  Telephone Incoming Call;Outgoing Call;Symptom Mgt  Abnormal Finding Date -  Confirmed Diagnosis Date -  Treatment Initiated Date -  Patient Visit Type -  Treatment Phase Other--Hospice  Barriers/Navigation Needs Coordination of Care  Education -  Interventions Coordination of Care  Referrals -  Coordination of Care Hospice/Palliative Care--called hospice and requested they contact patient to check on her  Education Method -  Support Groups/Services -  Acuity -  Time Spent with Patient 15  Patient left VM that she has extreme fatigue and is short of breath. Requested return call. Attempted to return call-no answer. Left VM to call Hospice nurse anytime she has change in condition and nurse will check on her and call us if needed. In future, she needs to leave any medical issues for Dr. Gearldine Shown collaborative nurse or triage instead of navigator. Called Hospice and made them aware of her call and requested follow up.

## 2015-10-13 ENCOUNTER — Telehealth: Payer: Self-pay | Admitting: *Deleted

## 2015-10-13 NOTE — Telephone Encounter (Signed)
Left VM to confirm she will keep her appointment on Monday at 11:15

## 2015-10-17 ENCOUNTER — Other Ambulatory Visit (HOSPITAL_BASED_OUTPATIENT_CLINIC_OR_DEPARTMENT_OTHER)

## 2015-10-17 ENCOUNTER — Ambulatory Visit (HOSPITAL_BASED_OUTPATIENT_CLINIC_OR_DEPARTMENT_OTHER): Payer: Medicare Other | Admitting: Oncology

## 2015-10-17 ENCOUNTER — Telehealth: Payer: Self-pay | Admitting: Oncology

## 2015-10-17 ENCOUNTER — Encounter: Payer: Self-pay | Admitting: *Deleted

## 2015-10-17 ENCOUNTER — Ambulatory Visit: Payer: Medicare Other

## 2015-10-17 ENCOUNTER — Telehealth: Payer: Self-pay | Admitting: *Deleted

## 2015-10-17 VITALS — BP 118/72 | HR 98 | Temp 97.8°F | Resp 24

## 2015-10-17 DIAGNOSIS — C170 Malignant neoplasm of duodenum: Secondary | ICD-10-CM | POA: Diagnosis present

## 2015-10-17 DIAGNOSIS — C179 Malignant neoplasm of small intestine, unspecified: Secondary | ICD-10-CM

## 2015-10-17 DIAGNOSIS — R1110 Vomiting, unspecified: Secondary | ICD-10-CM | POA: Diagnosis not present

## 2015-10-17 DIAGNOSIS — C787 Secondary malignant neoplasm of liver and intrahepatic bile duct: Secondary | ICD-10-CM

## 2015-10-17 DIAGNOSIS — C257 Malignant neoplasm of other parts of pancreas: Secondary | ICD-10-CM

## 2015-10-17 LAB — COMPREHENSIVE METABOLIC PANEL
ALT: 44 U/L (ref 0–55)
ANION GAP: 13 meq/L — AB (ref 3–11)
AST: 87 U/L — ABNORMAL HIGH (ref 5–34)
Albumin: 2.6 g/dL — ABNORMAL LOW (ref 3.5–5.0)
Alkaline Phosphatase: 390 U/L — ABNORMAL HIGH (ref 40–150)
BUN: 28 mg/dL — ABNORMAL HIGH (ref 7.0–26.0)
CALCIUM: 8.9 mg/dL (ref 8.4–10.4)
CHLORIDE: 108 meq/L (ref 98–109)
CO2: 21 meq/L — AB (ref 22–29)
CREATININE: 4 mg/dL — AB (ref 0.6–1.1)
EGFR: 10 mL/min/{1.73_m2} — AB (ref >90)
Glucose: 125 mg/dl (ref 70–140)
POTASSIUM: 4.4 meq/L (ref 3.5–5.1)
Sodium: 142 mEq/L (ref 136–145)
Total Bilirubin: 0.77 mg/dL (ref 0.20–1.20)
Total Protein: 6.4 g/dL (ref 6.4–8.3)

## 2015-10-17 LAB — CBC WITH DIFFERENTIAL/PLATELET
BASO%: 1.3 % (ref 0.0–2.0)
BASOS ABS: 0.1 10*3/uL (ref 0.0–0.1)
EOS%: 5.4 % (ref 0.0–7.0)
Eosinophils Absolute: 0.4 10*3/uL (ref 0.0–0.5)
HCT: 27.6 % — ABNORMAL LOW (ref 34.8–46.6)
HGB: 8.7 g/dL — ABNORMAL LOW (ref 11.6–15.9)
LYMPH%: 11 % — ABNORMAL LOW (ref 14.0–49.7)
MCH: 28.2 pg (ref 25.1–34.0)
MCHC: 31.5 g/dL (ref 31.5–36.0)
MCV: 89.3 fL (ref 79.5–101.0)
MONO#: 1.1 10*3/uL — ABNORMAL HIGH (ref 0.1–0.9)
MONO%: 13.9 % (ref 0.0–14.0)
NEUT%: 68.4 % (ref 38.4–76.8)
NEUTROS ABS: 5.2 10*3/uL (ref 1.5–6.5)
PLATELETS: 272 10*3/uL (ref 145–400)
RBC: 3.09 10*6/uL — AB (ref 3.70–5.45)
RDW: 19.5 % — ABNORMAL HIGH (ref 11.2–14.5)
WBC: 7.5 10*3/uL (ref 3.9–10.3)
lymph#: 0.8 10*3/uL — ABNORMAL LOW (ref 0.9–3.3)
nRBC: 0 % (ref 0–0)

## 2015-10-17 MED ORDER — HEPARIN SOD (PORK) LOCK FLUSH 100 UNIT/ML IV SOLN
500.0000 [IU] | Freq: Once | INTRAVENOUS | Status: AC
  Administered 2015-10-17: 500 [IU] via INTRAVENOUS
  Filled 2015-10-17: qty 5

## 2015-10-17 MED ORDER — SODIUM CHLORIDE 0.9% FLUSH
10.0000 mL | INTRAVENOUS | Status: DC | PRN
Start: 2015-10-17 — End: 2015-10-17
  Administered 2015-10-17: 10 mL via INTRAVENOUS
  Filled 2015-10-17: qty 10

## 2015-10-17 NOTE — Progress Notes (Signed)
Patient in for [facility] access and lab draw today. Patient's son reports that the patient is not feeling well, and he requests room with a bed so that the patient can rest. Dr. Gearldine Shown Desk Nurse Thane Edu, RN made aware. Patient in wheelchair at this time and discharged from [place] with son. All labs to be drawn by Thane Edu, RN Desk Nurse for Dr. Benay Spice.

## 2015-10-17 NOTE — Telephone Encounter (Signed)
Opened in error

## 2015-10-17 NOTE — Telephone Encounter (Signed)
per pof to sch pt appt-gave pt copy of avs °

## 2015-10-17 NOTE — Progress Notes (Signed)
CMET results faxed to Dr. Joelyn Oms at [facility].

## 2015-10-17 NOTE — Progress Notes (Signed)
  [place] OFFICE PROGRESS NOTE   Diagnosis: Small bowel carcinoma  INTERVAL HISTORY:    Ms. Dorenkamp returns as scheduled. She is now living at home. She has in home care from 7 PM until 5 AM. She is eating and denies pain. There have been 3 episodes of nausea/vomiting since she was discharged from the skilled nursing facility. She vomited at the Cancer center today.  Ms. Sala ambulates in the home. She is not pleased with the current Home care company.   Objective:  Vital signs in last 24 hours:  Blood pressure 118/72, pulse 98, temperature 97.8 F (36.6 C), temperature source Oral, resp. rate 24, SpO2 100 %.  Resp:  Lungs clear bilaterally Cardio:  Regular rate and rhythm GI:  Soft, mild tenderness in the right upper abdomen, no hepatomegaly, no mass Vascular:  No leg edema Neuro: the leg strength appears intact bilaterally    Portacath/PICC-without erythema  Lab Results:  Lab Results  Component Value Date   WBC 7.5 10/17/2015   HGB 8.7* 10/17/2015   HCT 27.6* 10/17/2015   MCV 89.3 10/17/2015   PLT 272 10/17/2015   NEUTROABS 5.2 10/17/2015     Medications: I have reviewed the patient's current medications.  Assessment/Plan: 1. Adenocarcinoma involving a duodenal mass  CT 08/27/2015 consistent with a duodenal mass/uncinate mass, liver metastases, pulmonary metastases, and metastatic lymphadenopathy  Cycle 1 FOLFIRI 09/08/2015  2. Anorexia/weight loss  3.Nausea/vomiting-most likely secondary to the duodenal mass or C. difficile colitis-improved  4. Polycystic kidney disease/renal failure  5. Anemia secondary to renal failure, bleeding from the duodenal tumor, and chemotherapy, status post a red cell transfusion 09/14/2015 , improved  6. Neutropenia/thrombocytopenia secondary to chemotherapy-resolved  7. History of diarrhea secondary chemotherapy and C. difficile colitis-resolved  8. Escherichia coli bacteremia  09/16/2015  9. C. difficile colitis-Flagyl started 09/17/2015     Disposition:   Ms. Frymire appears to be declining slowly. She has enrolled in the [facility] hospice program. She will discontinue amlodipine. Ms. Zamor will return for an office visit in approximately 3 weeks.  we will ask the [place] worker to contact her regarding options for in home care.    she had an  Isolated episode of nausea/vomiting in the office today    Betsy Coder, MD  10/17/2015  12:54 PM

## 2015-10-17 NOTE — Progress Notes (Signed)
Erica Simpson  Clinical Social Simpson was referred by nurse for assessment of psychosocial needs.  Clinical Social Worker met with patient and son briefly after their appt with Dr Benay Spice. Pt expressed she really does not want CNA at night. They were not happy with current agency. CSW explained since they set this up as pvt duty nursing, they can look for new provider at will. No orders need to be done in order to make this change. Son still has pvt duty Estate manager/land agent. CSW suggested pt and family also discuss this through hospice CSW, Erica Simpson, as she may have some suggestions. Pt appears to want to have some independence back at home, but family is fearful of a fall or event when pt is alone. This would be a good discussion with hospice CSW to help family process as a unit. CSW left vm for Erica Simpson, CSW at [facility] to follow up with family.    Clinical Social Simpson interventions: Resource education Supportive listening  Erica Simpson, [place] Worker [place]  [place] Phone: [phone] Fax: [phone]

## 2015-10-26 ENCOUNTER — Telehealth: Payer: Self-pay | Admitting: *Deleted

## 2015-10-26 ENCOUNTER — Telehealth: Payer: Self-pay | Admitting: Oncology

## 2015-10-26 NOTE — Telephone Encounter (Signed)
cld & spoke to pt sone and gave appt time @ 9:30 for 4/6 appt

## 2015-10-26 NOTE — Telephone Encounter (Signed)
Returned call to pt's son with appt for 4/6 @ 0930. [name] reports Hospice RN came out, instructed them to make sure she is drinking fluids. He will call back if they are unable to keep this appt.

## 2015-10-26 NOTE — Telephone Encounter (Signed)
Call from pt's son requesting office visit. Reports pt has "had a bad 2 days." She is weak, barely able to stand and having difficulty eating. He reports this is a change compared to when hospice RN last saw her. Recommended he call hospice for a visit today. He agrees to do so. Will review with providers for appointment.

## 2015-10-27 ENCOUNTER — Inpatient Hospital Stay (HOSPITAL_COMMUNITY)
Admission: AD | Admit: 2015-10-27 | Discharge: 2015-10-28 | DRG: 375 | Disposition: A | Discharge status: Skilled Nursing Facility | Source: Ambulatory Visit | Attending: Oncology | Admitting: Oncology

## 2015-10-27 ENCOUNTER — Encounter (HOSPITAL_COMMUNITY): Payer: Self-pay

## 2015-10-27 ENCOUNTER — Ambulatory Visit: Payer: Medicare Other | Admitting: Oncology

## 2015-10-27 VITALS — BP 100/73 | HR 119 | Temp 97.6°F | Resp 17 | Ht 63.0 in

## 2015-10-27 DIAGNOSIS — Z515 Encounter for palliative care: Secondary | ICD-10-CM | POA: Diagnosis present

## 2015-10-27 DIAGNOSIS — R630 Anorexia: Secondary | ICD-10-CM | POA: Diagnosis present

## 2015-10-27 DIAGNOSIS — K219 Gastro-esophageal reflux disease without esophagitis: Secondary | ICD-10-CM | POA: Diagnosis present

## 2015-10-27 DIAGNOSIS — G893 Neoplasm related pain (acute) (chronic): Secondary | ICD-10-CM | POA: Diagnosis present

## 2015-10-27 DIAGNOSIS — A047 Enterocolitis due to Clostridium difficile: Secondary | ICD-10-CM

## 2015-10-27 DIAGNOSIS — Z66 Do not resuscitate: Secondary | ICD-10-CM | POA: Diagnosis present

## 2015-10-27 DIAGNOSIS — C179 Malignant neoplasm of small intestine, unspecified: Secondary | ICD-10-CM | POA: Diagnosis not present

## 2015-10-27 DIAGNOSIS — R112 Nausea with vomiting, unspecified: Secondary | ICD-10-CM | POA: Diagnosis present

## 2015-10-27 DIAGNOSIS — C799 Secondary malignant neoplasm of unspecified site: Secondary | ICD-10-CM | POA: Diagnosis present

## 2015-10-27 DIAGNOSIS — B9620 Unspecified Escherichia coli [E. coli] as the cause of diseases classified elsewhere: Secondary | ICD-10-CM | POA: Diagnosis not present

## 2015-10-27 DIAGNOSIS — I10 Essential (primary) hypertension: Secondary | ICD-10-CM | POA: Diagnosis present

## 2015-10-27 DIAGNOSIS — N19 Unspecified kidney failure: Secondary | ICD-10-CM | POA: Diagnosis present

## 2015-10-27 DIAGNOSIS — R627 Adult failure to thrive: Secondary | ICD-10-CM | POA: Diagnosis present

## 2015-10-27 MED ORDER — HYDROCODONE-ACETAMINOPHEN 5-325 MG PO TABS
ORAL_TABLET | ORAL | Status: AC
  Filled 2015-10-27: qty 1

## 2015-10-27 MED ORDER — LORAZEPAM 0.5 MG PO TABS
0.5000 mg | ORAL_TABLET | Freq: Four times a day (QID) | ORAL | Status: DC | PRN
  Administered 2015-10-27 – 2015-10-28 (×5): 0.5 mg via SUBLINGUAL
  Filled 2015-10-27 (×5): qty 1

## 2015-10-27 MED ORDER — SENNA 8.6 MG PO TABS
1.0000 | ORAL_TABLET | Freq: Two times a day (BID) | ORAL | Status: DC
  Administered 2015-10-27 – 2015-10-28 (×2): 8.6 mg via ORAL
  Filled 2015-10-27 (×2): qty 1

## 2015-10-27 MED ORDER — HYDROCODONE-ACETAMINOPHEN 5-325 MG PO TABS: 1.0000 | ORAL_TABLET | ORAL | Status: DC | PRN

## 2015-10-27 MED ORDER — LIDOCAINE-PRILOCAINE 2.5-2.5 % EX CREA: TOPICAL_CREAM | Freq: Once | CUTANEOUS | Status: DC

## 2015-10-27 MED ORDER — MORPHINE SULFATE (PF) 2 MG/ML IV SOLN
1.0000 mg | INTRAVENOUS | Status: DC | PRN
  Administered 2015-10-27 (×2): 2 mg via INTRAVENOUS
  Administered 2015-10-27: 1 mg via INTRAVENOUS
  Administered 2015-10-27: 2 mg via INTRAVENOUS
  Filled 2015-10-27 (×4): qty 1

## 2015-10-27 MED ORDER — ONDANSETRON HCL 4 MG PO TABS: 4.0000 mg | ORAL_TABLET | Freq: Four times a day (QID) | ORAL | Status: DC | PRN

## 2015-10-27 MED ORDER — LIP MEDEX EX OINT
TOPICAL_OINTMENT | CUTANEOUS | Status: AC
  Administered 2015-10-27: 16:00:00
  Filled 2015-10-27: qty 7

## 2015-10-27 MED ORDER — HYDROCODONE-ACETAMINOPHEN 5-325 MG PO TABS: 1.0000 | ORAL_TABLET | Freq: Once | ORAL | Status: DC

## 2015-10-27 MED ORDER — POLYETHYLENE GLYCOL 3350 17 G PO PACK: 17.0000 g | PACK | Freq: Every day | ORAL | Status: DC | PRN

## 2015-10-27 MED ORDER — LORAZEPAM 2 MG/ML IJ SOLN: 0.5000 mg | Freq: Four times a day (QID) | INTRAMUSCULAR | Status: DC | PRN

## 2015-10-27 MED ORDER — ONDANSETRON HCL 4 MG/2ML IJ SOLN: 4.0000 mg | Freq: Four times a day (QID) | INTRAMUSCULAR | Status: DC | PRN

## 2015-10-27 MED ORDER — SODIUM CHLORIDE 0.9 % IV SOLN
INTRAVENOUS | Status: DC
  Administered 2015-10-27: 12:00:00 via INTRAVENOUS

## 2015-10-27 NOTE — H&P (Signed)
Patient History and Physical   Erica Simpson [id number] [birth year] 80 y.o. 10/27/2015    Patient Identification: 80 year old with metastatic small bowel cancer  HPI:  Erica Simpson was diagnosed with metastatic small bowel cancer in February 2017. She completed 1 cycle of chemotherapy with FOLFIRI 09/08/2015. She was admitted with Escherichia coli bacteremia and C. difficile colitis following chemotherapy. She elected to pursue no further chemotherapy and has enrolled in the [facility] program. Her clinical status has declined over the past several weeks. She presented to the Cancer center today prior to a scheduled visit. Her family report she has been nonambulatory for the past few days and has very little oral intake. She has intermittent nausea/vomiting and abdominal pain. It is difficult for the family to take care of her in the home despite help from the Hospice team.   PMH:  Past Medical History  Diagnosis Date  . Adenomatous colon polyp 02/2001  . Diverticulosis   . Internal hemorrhoids   . Polycystic kidney disease     Stage 4   . GERD (gastroesophageal reflux disease)   . Hypertension   . Lichen planus     .   C. difficile colitis February 2017   .   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 Oophorectomy    . Appendectomy    . Cholecystectomy    . Partial hysterectomy      Allergies:  No Known Allergies  Medications:  Medications Prior to Admission  Medication Sig Dispense Refill  . alendronate (FOSAMAX) 70 MG tablet Take 70 mg by mouth once a week. Reported on 10/27/2015    . bisacodyl (DULCOLAX) 5 MG EC tablet Take 5 mg by mouth as needed for moderate constipation. Reported on 10/27/2015    . HYDROcodone-acetaminophen (NORCO/VICODIN) 5-325 MG tablet Take 1 tablet by mouth every 4 (four) hours as needed for moderate pain. (Patient not taking: Reported on 10/27/2015) 60 tablet 0  .  lidocaine-prilocaine (EMLA) cream Apply small amount over port area 1 hour prior to treatment and cover with plastic wrap.  DO NOT RUB IN 30 g PRN  . loratadine (CLARITIN) 10 MG tablet Take 10 mg by mouth daily. Reported on 10/27/2015    . LORazepam (ATIVAN) 0.5 MG tablet Take 1 tablet (0.5 mg total) by mouth at bedtime. 30 tablet 0  . omeprazole (PRILOSEC) 40 MG capsule Take 40 mg by mouth daily as needed (acid reflux).     . ondansetron (ZOFRAN) 4 MG tablet Take 1 tablet (4 mg total) by mouth every 6 (six) hours. 12 tablet 0  . traMADol (ULTRAM) 50 MG tablet Take 1 tablet (50 mg total) by mouth 3 (three) times daily as needed for moderate pain. 20 tablet 0    Social History:  She lives in [place]. She is cared for by her sister and son. She is retired from a Veterinary surgeon job. She does not smoke. No risk factor for HIV or hepatitis.  Family History:  Family History  Problem Relation Age of Onset  . Breast cancer Sister   . Heart disease Mother   . Kidney disease Mother   . Colon cancer Neg Hx   . Kidney disease Father   . Heart disease Father     Review of Systems:  Positives include:Intermittent abdominal pain, intermittent nausea and vomiting, anorexia, generalized weakness  A complete ROS was otherwise negative.   Physical Exam:  Blood pressure 94/65, pulse 114, temperature  97.8 F (36.6 C), temperature source Oral, resp. rate 20, SpO2 98 %.  HEENT: [place] over the tongue, no buccal thrush Lungs: Clear bilaterally Cardiac: Regular rate and rhythm Abdomen: Soft, no mass, no hepatomegaly, nontender  Vascular: No leg edema Neurologic: Alert, follows commands Skin: No rash  Lab Results:  Lab Results  Component Value Date   WBC 7.5 10/17/2015   HGB 8.7* 10/17/2015   HCT 27.6* 10/17/2015   MCV 89.3 10/17/2015   PLT 272 10/17/2015   NEUTROABS 5.2 10/17/2015      Impression and Plan:  1. Metastatic small bowel carcinoma 2. Pain secondary to #1 3.  Intermittent nausea/vomiting secondary to #1 4. Anorexia/failure to thrive 5. Renal failure   Erica Simpson has metastatic small bowel carcinoma. She is enrolled in the [facility] hospice program. Her son and sister have been taking care of her at home. She has developed profound weakness over the past several days and his nonambulatory. It is now difficult for her family to care for her at home. There are no available beds today at [facility].  She will be admitted for comfort care measures.  I discussed the situation with her son and sister. They understand her lifespan will likely be measured in days to a week. Erica Simpson agrees to hospital admission. We will consult the [facility] program to consider transfer to [facility] when a bed is available.     Betsy Coder, MD  10/27/2015, 11:57 AM

## 2015-10-27 NOTE — Progress Notes (Signed)
[facility]- [facility]-GIP RN Visit  This is a related, covered GIP admission from 10/27/15 to [facility] diagnosis of small bowel CA, per Dr. Tomasa Hosteller. Patient is a DNR.Spoke with patient and son at bedside.  Patient reports a significant decline in her functional status over the past couple weeks.  She reports only eating bites of food and having nausea/vomiting when she does eat.  She also reports increased fatigue and weakness.  She stated she has had multiple falls when attempting to get out of bed.  She reported her ultimate goal is comfort and she would like [facility] for EOL care pending bed availability.  [facility] social worker Molly aware and plans to meet with family in the morning to discuss in further detail.  Patient denies any pain.  She is on RA with her O2 saturation and respirations WNL.  She has received 0.5 mg Ativan SL for anxiety and 2 mg of Morphine IV for severe pain.  She is currently receiving IVF @ [facility] to chest port.  She is unable to keep any solid food down at this time.  Offered emotional support and left contact information for family in any needs may arise. [facility] will continue to follow daily.  Updated [facility] medication list and transfer summary left on chart.  Please call with any hospice-related questions or concerns.  Thank you, Freddi Starr RN, [facility] Liaison [phone]

## 2015-10-27 NOTE — Progress Notes (Signed)
Nutrition Brief Note  Pt identified as at nutrition risk on the Malnutrition Screen Tool.  Chart reviewed. Pt now transitioning to comfort care. Awaiting bed at [facility]. No nutrition interventions warranted at this time.  Please consult as needed.   Clayton Bibles, MS, RD, LDN Pager: [phone] After Hours Pager: [phone]

## 2015-10-27 NOTE — Progress Notes (Signed)
See history and physical 10/27/2015

## 2015-10-28 ENCOUNTER — Telehealth: Payer: Self-pay

## 2015-10-28 MED ORDER — LORAZEPAM 0.5 MG PO TABS: 0.5000 mg | ORAL_TABLET | Freq: Four times a day (QID) | ORAL | Status: AC | PRN

## 2015-10-28 MED ORDER — HEPARIN SOD (PORK) LOCK FLUSH 100 UNIT/ML IV SOLN
500.0000 [IU] | Freq: Once | INTRAVENOUS | Status: DC
  Filled 2015-10-28: qty 5

## 2015-10-28 MED ORDER — SODIUM CHLORIDE 0.9 % IV SOLN: 20.0000 mL | INTRAVENOUS | Status: AC

## 2015-10-28 MED ORDER — MORPHINE SULFATE (PF) 2 MG/ML IV SOLN: 1.0000 mg | INTRAVENOUS | Status: AC | PRN

## 2015-10-28 NOTE — Telephone Encounter (Signed)
Writer spoke with Dr. Gearldine Shown nurse to have her ask Dr. Benay Spice to discharge patient from the hospital.  [facility] has a bed for patient.

## 2015-10-28 NOTE — Clinical Social Work Note (Signed)
Clinical Social Work Assessment  Patient Details  Name: Erica Simpson MRN: [id number] Date of Birth: May 10, 1936  Date of referral:  10/28/15               Reason for consult:  Discharge Planning                Permission sought to share information with:  Family Supports Permission granted to share information::  Yes, Verbal Permission Granted  Name::     Gabryela Traister  Agency::     Relationship::  son  Contact Information:  [phone]  Housing/Transportation Living arrangements for the past 2 months:  [place] of Information:  Other (Comment Required), Patient, Adult Children Luan Moore, [facility] SW) Patient Interpreter Needed:  None Criminal Activity/Legal Involvement Pertinent to Current Situation/Hospitalization:  No - Comment as needed Significant Relationships:  Adult Children Lives with:  Adult Children Do you feel safe going back to the place where you live?  No Need for family participation in patient care:  Yes (Comment)  Care giving concerns:  Pt admitted from Dr. Benay Spice office yesterday-pt from home with [facility] and seeking bed at [facility] as care at home not manageable.    Social Worker assessment / plan:    Pt admitted yesterday as pt care could not be managed at home and [facility] did not have a bed to accept pt into yesterday.  CSW received notification from [place], Luan Moore this morning that [facility] has bed available today.  CSW spoke with pt and pt family at bedside to discuss process of transfer to [facility].  CSW notified Dr. Benay Spice nurse and requested discharge information be completed.   CSW to assist with pt discharge to [facility].   Employment status:  Retired Forensic scientist:  Other (Comment Required) ([facility]) PT Recommendations:    Information / Referral to community resources:  Other (Comment Required) [facility] referral )  Patient/Family's Response to care:  Pt and pt family  agreeable to transfer to [facility] today.  Patient/Family's Understanding of and Emotional Response to Diagnosis, Current Treatment, and Prognosis:  Pt and pt family relieved that [facility] could accept today because pt preferred to go straight to [facility].   Emotional Assessment Appearance:  Appears stated age Attitude/Demeanor/Rapport:  Other (appropriate) Affect (typically observed):  Appropriate Orientation:  Oriented to Self, Oriented to Place, Oriented to  Time, Oriented to Situation Alcohol / Substance use:  Not Applicable Psych involvement (Current and /or in the community):  No (Comment)  Discharge Needs  Concerns to be addressed:  Discharge Planning Concerns Readmission within the last 30 days:  No Current discharge risk:  Terminally ill Barriers to Discharge:  No Barriers Identified   [place], [place], LCSW 10/28/2015, 10:26 AM  [phone]

## 2015-10-28 NOTE — Progress Notes (Signed)
Hospice and [place] [facility]) Social Work (SW) note:  SW met with Pt's son, [name] provided education about [facility]) admission process/room and board charges at [facility], [name] voiced understanding of education provided. [facility] today, family is aware and agreeable to this plan. This SW has spoken to hospital CSW to make them aware that [facility] has a bed for Pt today. This SW has alerted [facility] that family accepted bed offer to [facility] today and will transfer there once DC arrangements have been made. Please call with any questions/needs [phone] Luan Moore, LCSW

## 2015-10-28 NOTE — Discharge Summary (Signed)
Physician Discharge Summary  Patient ID: Erica Simpson @ATTENDINGNPI @ MRN: [id number] DOB/AGE: September 05, 1935 80 y.o.  Admit date: 10/27/2015 Discharge date: 10/28/2015  Discharge Diagnoses:  Active Problems:   1.Metastatic small bowel carcinoma   2. Pain secondary to #1   3. Intermittent nausea/vomiting secondary to #1   4. Anorexia/failure to thrive   5. Renal failure   Discharged Condition: Improved  Discharge Labs:  none  Significant Diagnostic Studies: None  Consults: None  Procedures:  None  Disposition: 03-Skilled Nursing Facility     Medication List    STOP taking these medications        ondansetron 4 MG tablet  Commonly known as:  ZOFRAN      TAKE these medications        bisacodyl 5 MG EC tablet  Commonly known as:  DULCOLAX  Take 5 mg by mouth as needed for moderate constipation. Reported on 10/27/2015     lidocaine-prilocaine cream  Commonly known as:  EMLA  Apply small amount over port area 1 hour prior to treatment and cover with plastic wrap.  DO NOT RUB IN     loratadine 10 MG tablet  Commonly known as:  CLARITIN  Take 10 mg by mouth daily. Reported on 10/27/2015     LORazepam 0.5 MG tablet  Commonly known as:  ATIVAN  Place 1 tablet (0.5 mg total) under the tongue every 6 (six) hours as needed for anxiety (nausea).     morphine 2 MG/ML injection  Inject 0.5-1 mLs (1-2 mg total) into the vein every hour as needed.     omeprazole 40 MG capsule  Commonly known as:  PRILOSEC  Take 40 mg by mouth daily as needed (acid reflux).     sodium chloride 0.9 % infusion  Inject 20 mLs into the vein continuous.     traMADol 50 MG tablet  Commonly known as:  ULTRAM  Take 1 tablet (50 mg total) by mouth 3 (three) times daily as needed for moderate pain.          Hospital Course: [name] is a 80 year old with metastatic small bowel carcinoma. She was diagnosed in February 2017 and completed one cycle of FOLFIRI chemotherapy 09/08/2015. She was  admitted with Escherichia coli bacteremia and C. difficile colitis following chemotherapy. She elected to pursue no further chemotherapy and was enrolled in the [facility] program.  She presented to the office on 10/27/2015 with a marked decline in her performance status over the preceding week. Her family can low longer care for her in the home. She was admitted for supportive care measures and placement. She appeared more alert on the morning of 10/28/2015. We discussed returning home versus placement with Hospice. She prefers placement. Arrangements were made for transfer to [facility] on 10/28/2015.       Signed: Betsy Coder, MD 10/28/2015, 10:23 AM

## 2015-10-28 NOTE — Progress Notes (Signed)
IP PROGRESS NOTE  Subjective:   She reports feeling better this morning. Some pain in the right subcostal region. She requests a liquid diet.  Objective: Vital signs in last 24 hours: Blood pressure 127/74, pulse 102, temperature 97.7 F (36.5 C), temperature source Oral, resp. rate 20, SpO2 98 %.  Intake/Output from previous day: 04/06 0701 - 04/07 0700 In: 240 [I.V.:240] Out: 50 [Urine:50]  Physical Exam:  HEENT: [place] over the tongue, no buccal thrush Lungs: Clear anteriorly Cardiac: Regular rate and rhythm Abdomen: No hepatomegaly, no mass Extremities: No leg edema   Portacath/PICC-without erythema   Medications: I have reviewed the patient's current medications.  Assessment/Plan: 1. Metastatic small bowel carcinoma 2. Pain secondary to #1 3. Intermittent nausea/vomiting secondary to #1 4. Anorexia/failure to thrive 5. Renal failure  Her performance status appears improved today. She states that she feels more comfortable at the hospital and prefers placement as opposed to returning home. The nursing staff will have further discussions with Erica Simpson and her family to decide on returning home while waiting on a bed at [facility].   LOS: 1 day   Betsy Coder, MD   10/28/2015, 8:33 AM

## 2015-10-28 NOTE — Progress Notes (Signed)
Pt for discharge to [facility].   CSW facilitated pt discharge needs including contacting faxing pt discharge information to facility, discussing with pt and pt family at bedside, providing RN phone number to call report, and arranging ambulance transport for pt to [facility].  Pt and pt family coping appropriately with transition to [facility].   No further social work needs identified at this time.  CSW signing off.   Alison Murray, MSW, [facility] [employer] [phone]

## 2015-11-02 ENCOUNTER — Encounter: Payer: Self-pay | Admitting: *Deleted

## 2015-11-02 ENCOUNTER — Other Ambulatory Visit: Payer: Self-pay | Admitting: *Deleted

## 2015-11-02 NOTE — Progress Notes (Signed)
Call received from [place] at [facility] to inform [facility] that pt has passed away.  Will notify Dr. Benay Spice and cancel all of pt.'s appts.

## 2015-11-04 ENCOUNTER — Ambulatory Visit: Payer: Medicare Other | Admitting: Oncology

## 2015-11-21 DEATH — deceased

## 2016-01-23 ENCOUNTER — Other Ambulatory Visit: Payer: Self-pay | Admitting: Nurse Practitioner

## 2017-05-21 IMAGING — CT CT ABD-PELV W/O CM
2 of 4 series · 15 of 46 positions shown, 17 images · non-contrast
Comparison: 11/14/2006

CLINICAL DATA: Duodenal mass or recently diagnosed by endoscopy. 10
pound weight loss and nausea over past 2 months. Polycystic kidney
disease.

EXAM:
CT ABDOMEN AND PELVIS WITHOUT CONTRAST
TECHNIQUE: Multidetector CT imaging of the abdomen and pelvis was performed
following the standard protocol without IV contrast.

[Series 2: abd/ pelvis 5.0 i40f 2 · axial · 0.73mm/px · z∈[-465,-45]mm · 12 of 92 slices shown, 14 images]
[im 4/92  soft-tissue]
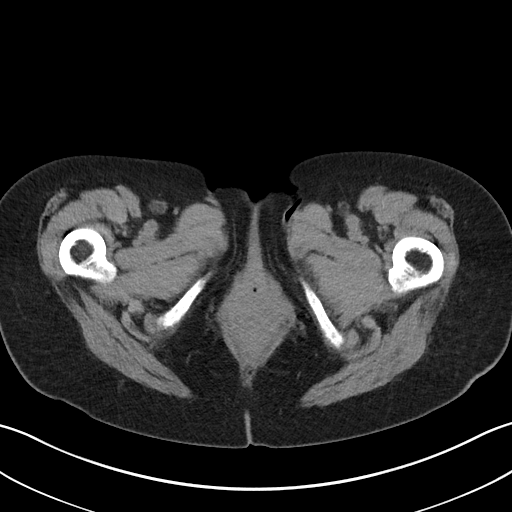
[im 4/92  bone]
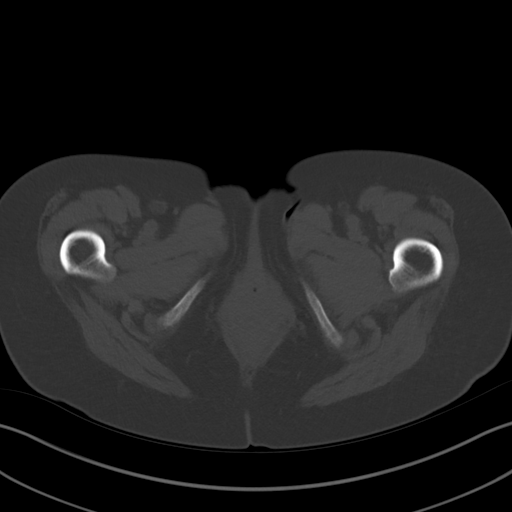
[im 12/92  soft-tissue]
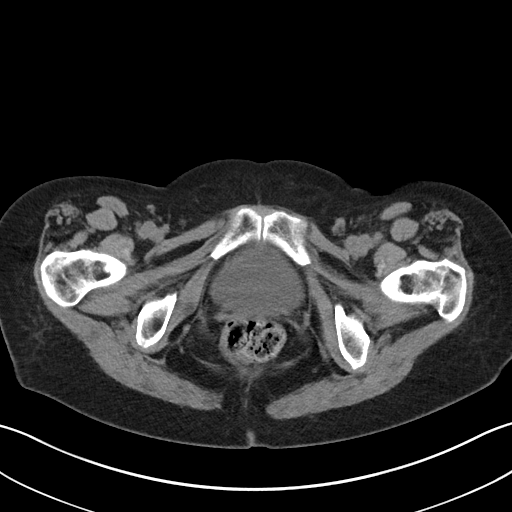
[im 19/92  soft-tissue]
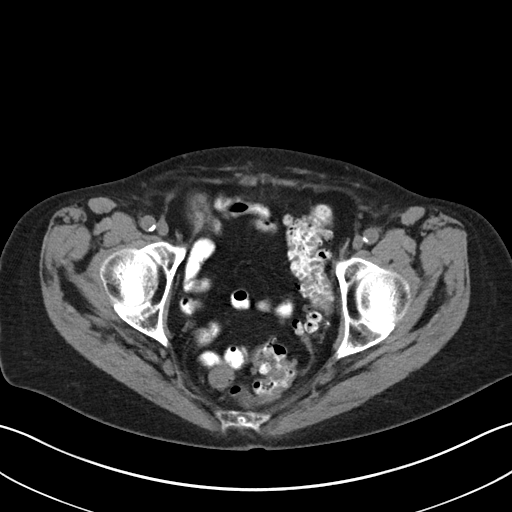
[im 27/92  soft-tissue]
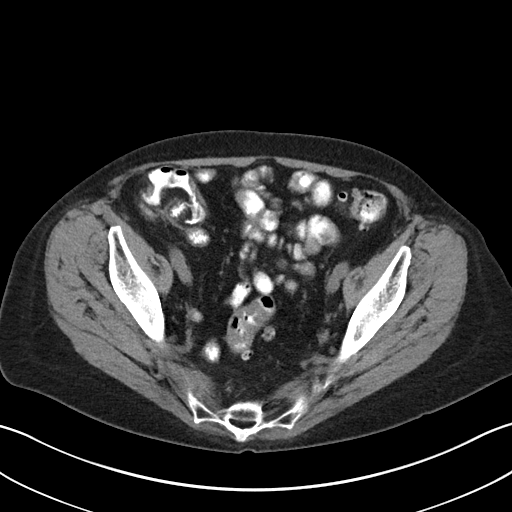
[im 35/92  soft-tissue]
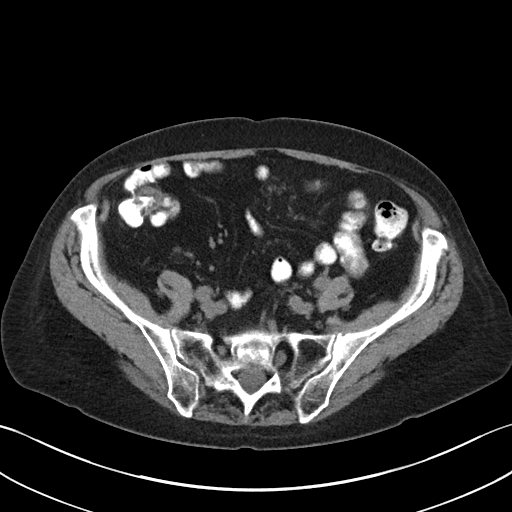
[im 42/92  soft-tissue]
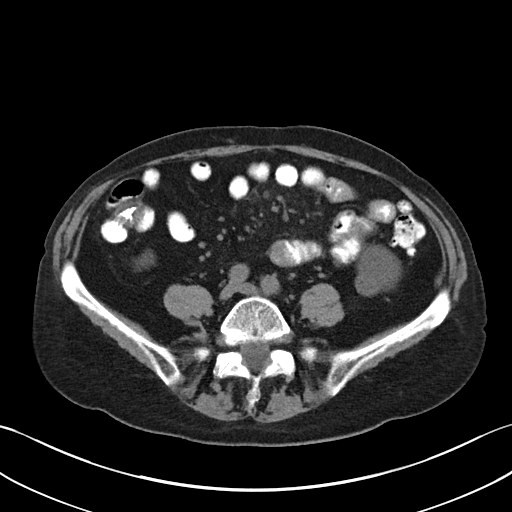
[im 50/92  soft-tissue]
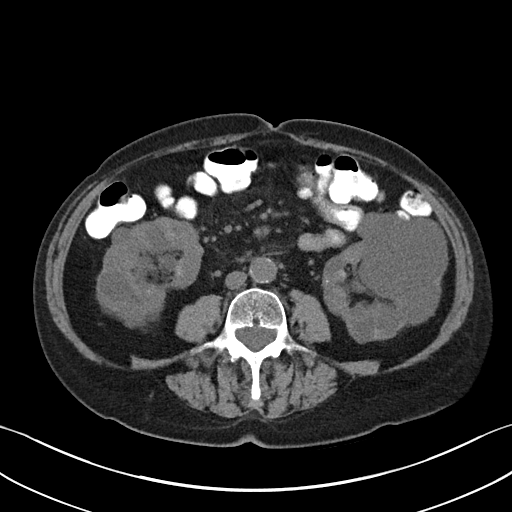
[im 57/92  soft-tissue]
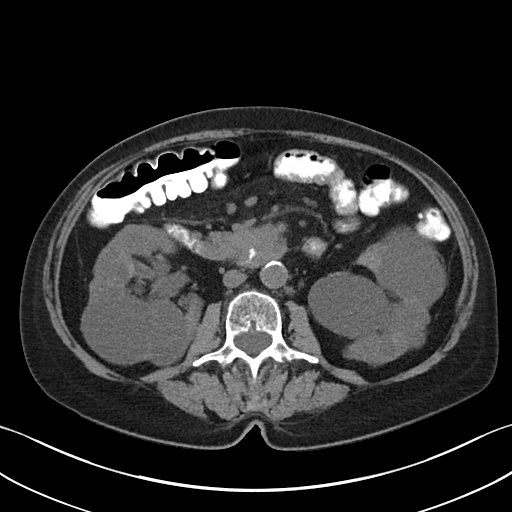
[im 65/92  soft-tissue]
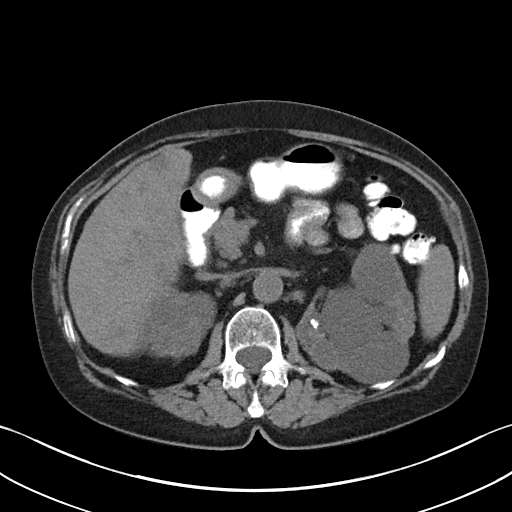
[im 65/92  bone]
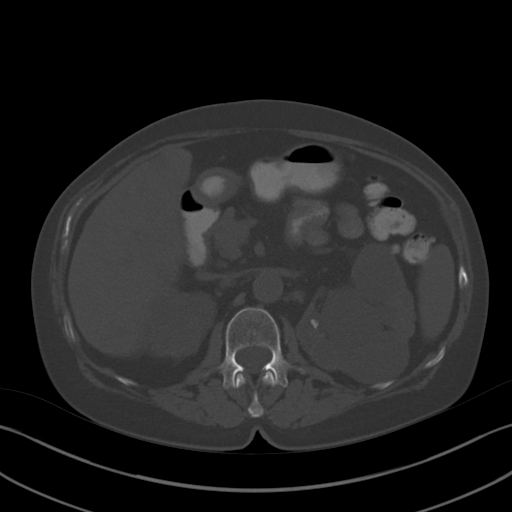
[im 73/92  soft-tissue]
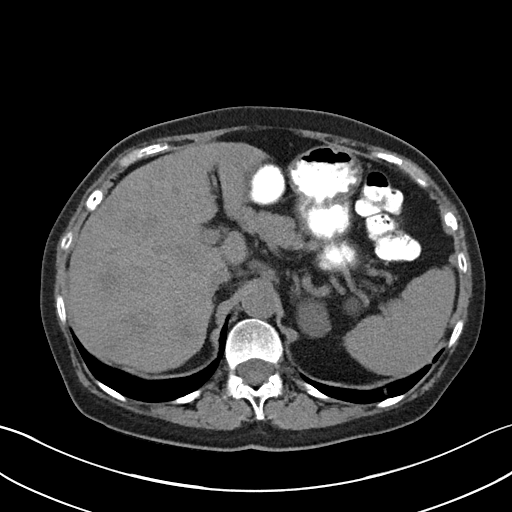
[im 80/92  soft-tissue]
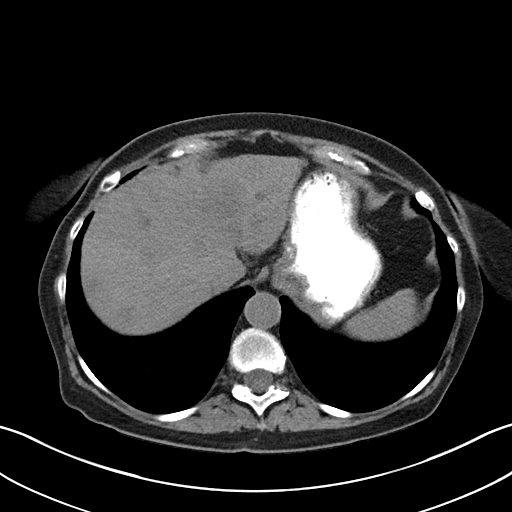
[im 88/92  soft-tissue]
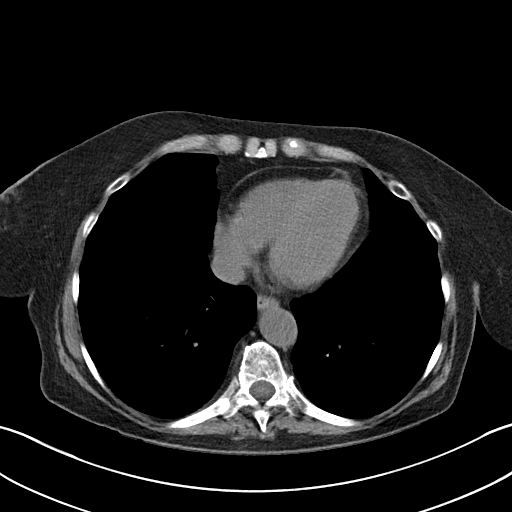

[Series 5: cor st · coronal · 0.66mm/px · 3 of 79 slices shown]
[im 27/79  soft-tissue]
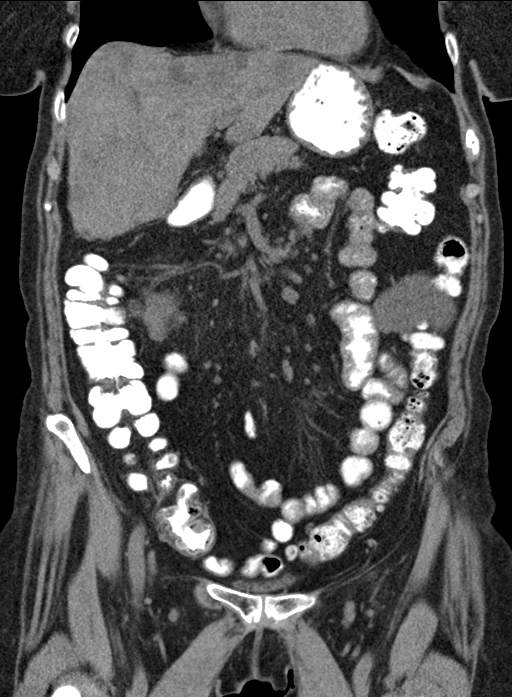
[im 35/79  soft-tissue]
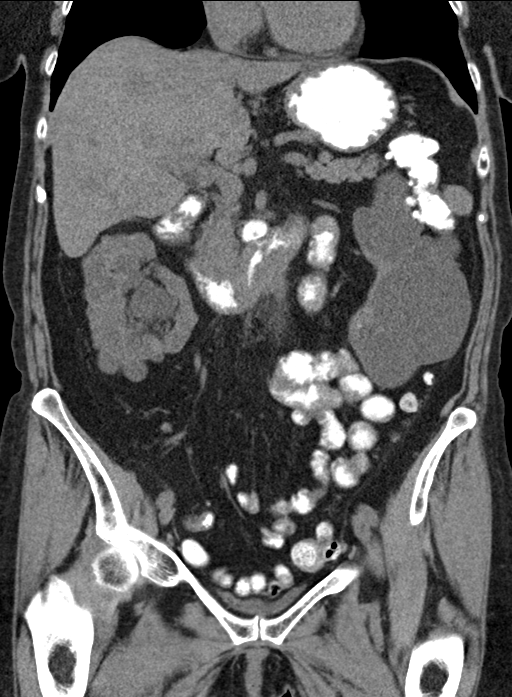
[im 44/79  soft-tissue]
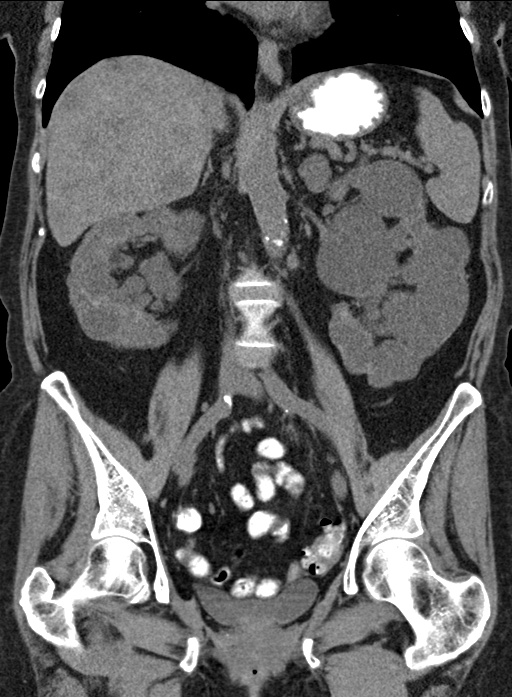

[15 of 46 positions shown; findings below may reference images not displayed]

FINDINGS: Lower chest: New sub-cm pulmonary nodules are seen in both lung
bases, highly suspicious for pulmonary metastases.

Hepatobiliary: Several previously seen hepatic cysts are again
noted, however there are multiple ill-defined soft tissue acute
attenuation masses seen throughout the liver which are new since
previous study and consistent with diffuse liver metastases.

Pancreas: A soft tissue mass is seen involving the transverse
duodenum which is contiguous with the pancreatic uncinate process.
This measures 3.0 x 5.8 cm on image 37/series 2. The remainder the
pancreas is unremarkable in appearance on this unenhanced exam.

Spleen: Within normal limits in size.

Adrenals/Urinary Tract: Adrenal glands are unremarkable. Both
kidneys are enlarged with numerous cystic lesions again seen,
consistent polycystic kidney disease. Several small left renal
calculi are again seen, however there is no evidence of ureteral
calculi or hydronephrosis.

Stomach/Bowel: Mass involving the transverse duodenum measures 3.0 x
5.8 cm on image 37/series 2. This mass is contiguous with the
pancreatic uncinate process. No evidence of bowel obstruction.
Colonic diverticulosis noted, without evidence of diverticulitis.

Vascular/Lymphatic: Mild lymphadenopathy seen within the central
small bowel mesenteric, with largest index lymph node measuring
cm on image 33/series 2. Mild lymphadenopathy also seen within the
porta hepatis, with the index portacaval lymph node measuring 11 mm
on image 26/series 2.

Reproductive: Prior hysterectomy noted. Adnexal regions are
unremarkable in appearance. Tiny amount of free fluid noted in
pelvis.

Other: None.

Musculoskeletal:  No suspicious bone lesions identified.
IMPRESSION: 5.8 cm soft tissue mass involving the transverse duodenum which is
also contiguous with the pancreatic uncinate process. Differential
diagnosis includes primary duodenal carcinoma and pancreatic
carcinoma.

Metastatic lymphadenopathy within the central small bowel mesentery
and porta hepatis.

Diffuse liver metastases.

New sub-cm bibasilar pulmonary nodules, highly suspicious for
pulmonary metastases.

Polycystic kidney disease and nonobstructive renal calculi again
noted.

## 2017-05-27 IMAGING — XA IR US GUIDE VASC ACCESS RIGHT
1 series · 1 of 1 positions shown · non-contrast
Comparison: none

CLINICAL DATA: Duodenal carcinoma. Needs long-term venous access
for chemotherapy.
TECHNIQUE: The procedure, risks, benefits, and alternatives were explained to
the patient. Questions regarding the procedure were encouraged and
answered. The patient understands and consents to the procedure. As
antibiotic prophylaxis, cefazolin 2 g was ordered pre-procedure and
administered intravenously within one hour of incision. Patency of
the right IJ vein was confirmed with ultrasound with image
documentation. An appropriate skin site was determined. Skin site
was marked. Region was prepped using maximum barrier technique
including cap and mask, sterile gown, sterile gloves, large sterile
sheet, and Chlorhexidine as cutaneous antisepsis. The region was
infiltrated locally with 1% lidocaine. Under real-time ultrasound
guidance, the right IJ vein was accessed with a 21 gauge
micropuncture needle; the needle tip within the vein was confirmed
with ultrasound image documentation. Needle was exchanged over a 018
guidewire for transitional dilator which allowed passage of the
Benson wire into the IVC. Over this, the transitional dilator was
exchanged for a 5 French MPA catheter. A small incision was made on
the right anterior chest wall and a subcutaneous pocket fashioned.
The power-injectable port was positioned and its catheter tunneled
to the right IJ dermatotomy site. The MPA catheter was exchanged
over an Amplatz wire for a peel-away sheath, through which the port
catheter, which had been trimmed to the appropriate length, was
advanced and positioned under fluoroscopy with its tip at the
cavoatrial junction. Spot chest radiograph confirms good catheter
position and no pneumothorax. The pocket was closed with deep
interrupted and subcuticular continuous 3-0 Monocryl sutures. The
port was flushed per protocol. The incisions were covered with
Dermabond then covered with a sterile dressing.

COMPLICATIONS:
COMPLICATIONS
None immediate

[Series 300: ir fluoro guide cv line right · 1 of 1 slices shown]
[im 1/1]
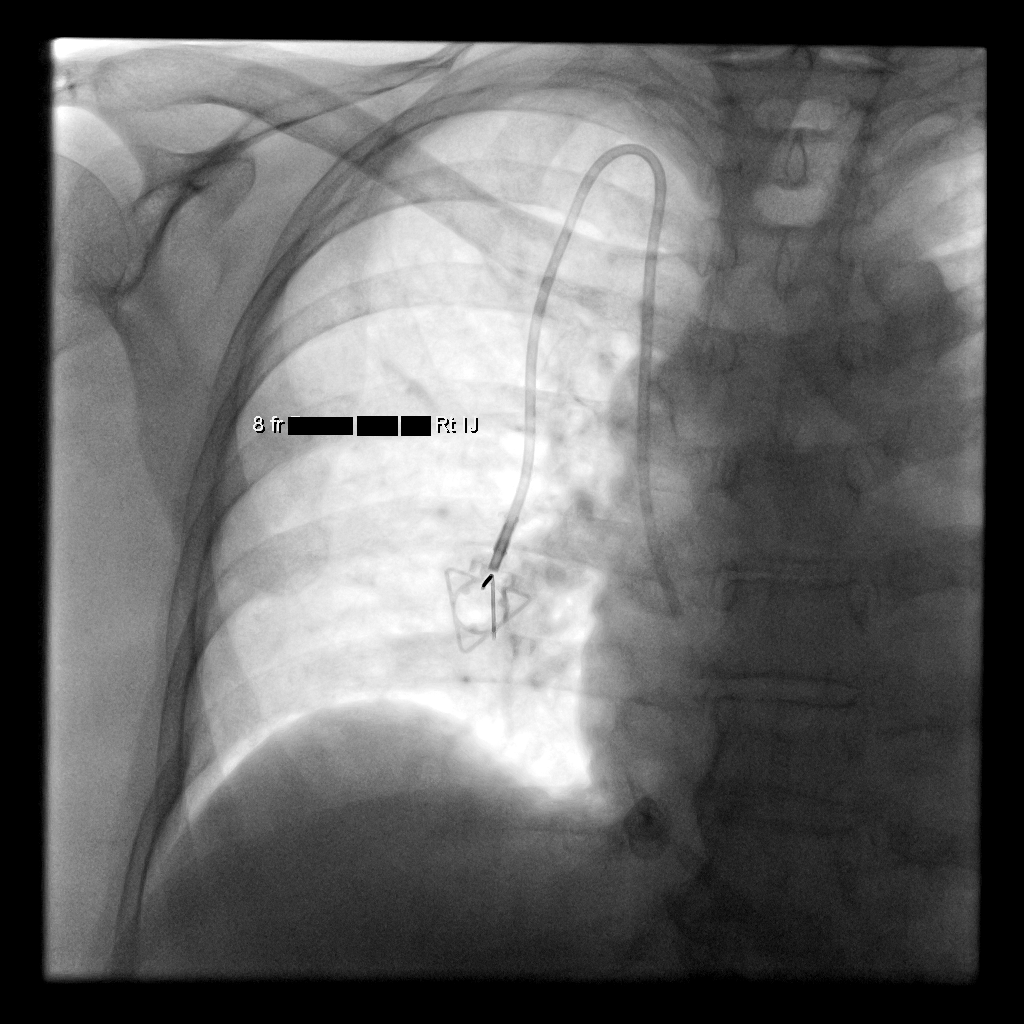

[1 of 1 positions shown; findings below may reference images not displayed]

EXAM:
TUNNELED PORT CATHETER PLACEMENT WITH ULTRASOUND AND FLUOROSCOPIC
GUIDANCE

FLUOROSCOPY TIME:  0.1 minute, 1 mGy

ANESTHESIA/SEDATION:
Intravenous Fentanyl and Versed were administered as conscious
sedation during continuous monitoring of the patient's level of
consciousness and physiological / cardiorespiratory status by the
radiology RN, with a total moderate sedation time of 16 minutes.
IMPRESSION: Technically successful right IJ power-injectable port catheter
placement. Ready for routine use.
